# Patient Record
Sex: Female | Born: 1945 | Race: White | Hispanic: No | Marital: Married | Smoking: Never smoker
Health system: Southern US, Community
[De-identification: ages and names within clinical notes are randomized; demographics above are authoritative.]

## PROBLEM LIST (undated history)

## (undated) DIAGNOSIS — IMO0001 Reserved for inherently not codable concepts without codable children: Secondary | ICD-10-CM

## (undated) DIAGNOSIS — M797 Fibromyalgia: Secondary | ICD-10-CM

## (undated) DIAGNOSIS — R911 Solitary pulmonary nodule: Secondary | ICD-10-CM

## (undated) DIAGNOSIS — F5102 Adjustment insomnia: Secondary | ICD-10-CM

## (undated) DIAGNOSIS — K219 Gastro-esophageal reflux disease without esophagitis: Secondary | ICD-10-CM

## (undated) DIAGNOSIS — D369 Benign neoplasm, unspecified site: Secondary | ICD-10-CM

## (undated) DIAGNOSIS — G4733 Obstructive sleep apnea (adult) (pediatric): Secondary | ICD-10-CM

## (undated) DIAGNOSIS — E669 Obesity, unspecified: Secondary | ICD-10-CM

## (undated) DIAGNOSIS — I509 Heart failure, unspecified: Secondary | ICD-10-CM

## (undated) DIAGNOSIS — K2270 Barrett's esophagus without dysplasia: Secondary | ICD-10-CM

## (undated) DIAGNOSIS — C569 Malignant neoplasm of unspecified ovary: Secondary | ICD-10-CM

## (undated) DIAGNOSIS — K222 Esophageal obstruction: Secondary | ICD-10-CM

## (undated) DIAGNOSIS — I10 Essential (primary) hypertension: Secondary | ICD-10-CM

## (undated) DIAGNOSIS — E119 Type 2 diabetes mellitus without complications: Secondary | ICD-10-CM

## (undated) DIAGNOSIS — E785 Hyperlipidemia, unspecified: Secondary | ICD-10-CM

## (undated) DIAGNOSIS — K449 Diaphragmatic hernia without obstruction or gangrene: Secondary | ICD-10-CM

## (undated) HISTORY — DX: Type 2 diabetes mellitus without complications: E11.9

## (undated) HISTORY — DX: Gastro-esophageal reflux disease without esophagitis: K21.9

## (undated) HISTORY — DX: Diaphragmatic hernia without obstruction or gangrene: K44.9

## (undated) HISTORY — DX: Esophageal obstruction: K22.2

## (undated) HISTORY — PX: CHOLECYSTECTOMY: SHX55

## (undated) HISTORY — DX: Hyperlipidemia, unspecified: E78.5

## (undated) HISTORY — DX: Heart failure, unspecified: I50.9

## (undated) HISTORY — DX: Barrett's esophagus without dysplasia: K22.70

## (undated) HISTORY — DX: Malignant neoplasm of unspecified ovary: C56.9

## (undated) HISTORY — DX: Benign neoplasm, unspecified site: D36.9

## (undated) HISTORY — DX: Essential (primary) hypertension: I10

## (undated) HISTORY — PX: TONSILLECTOMY: SUR1361

## (undated) HISTORY — DX: Reserved for inherently not codable concepts without codable children: IMO0001

## (undated) HISTORY — DX: Fibromyalgia: M79.7

## (undated) HISTORY — DX: Adjustment insomnia: F51.02

## (undated) HISTORY — DX: Solitary pulmonary nodule: R91.1

## (undated) HISTORY — PX: REPLACEMENT TOTAL KNEE: SUR1224

## (undated) HISTORY — DX: Obesity, unspecified: E66.9

## (undated) HISTORY — DX: Obstructive sleep apnea (adult) (pediatric): G47.33

## (undated) HISTORY — PX: TRACHEOSTOMY: SUR1362

---

## 1993-03-14 ENCOUNTER — Encounter: Payer: Self-pay | Admitting: Gastroenterology

## 1993-03-15 ENCOUNTER — Encounter: Payer: Self-pay | Admitting: Gastroenterology

## 2001-03-26 ENCOUNTER — Other Ambulatory Visit: Admission: RE | Admit: 2001-03-26 | Discharge: 2001-03-26 | Payer: Self-pay | Admitting: Obstetrics and Gynecology

## 2001-04-24 ENCOUNTER — Emergency Department (HOSPITAL_COMMUNITY): Admission: EM | Admit: 2001-04-24 | Discharge: 2001-04-25 | Payer: Self-pay | Admitting: Emergency Medicine

## 2001-11-04 ENCOUNTER — Ambulatory Visit (HOSPITAL_COMMUNITY): Admission: RE | Admit: 2001-11-04 | Discharge: 2001-11-04 | Payer: Self-pay

## 2002-03-05 ENCOUNTER — Encounter: Payer: Self-pay | Admitting: Family Medicine

## 2002-03-05 ENCOUNTER — Ambulatory Visit (HOSPITAL_COMMUNITY): Admission: RE | Admit: 2002-03-05 | Discharge: 2002-03-05 | Payer: Self-pay | Admitting: Family Medicine

## 2002-09-09 ENCOUNTER — Inpatient Hospital Stay (HOSPITAL_COMMUNITY): Admission: EM | Admit: 2002-09-09 | Discharge: 2002-09-10 | Payer: Self-pay | Admitting: Emergency Medicine

## 2002-09-09 ENCOUNTER — Encounter: Payer: Self-pay | Admitting: Emergency Medicine

## 2002-09-22 ENCOUNTER — Encounter (INDEPENDENT_AMBULATORY_CARE_PROVIDER_SITE_OTHER): Payer: Self-pay | Admitting: *Deleted

## 2002-09-22 ENCOUNTER — Ambulatory Visit (HOSPITAL_COMMUNITY): Admission: RE | Admit: 2002-09-22 | Discharge: 2002-09-22 | Payer: Self-pay | Admitting: Cardiovascular Disease

## 2002-09-22 ENCOUNTER — Encounter: Payer: Self-pay | Admitting: Cardiovascular Disease

## 2002-09-22 ENCOUNTER — Ambulatory Visit: Admission: RE | Admit: 2002-09-22 | Discharge: 2002-09-22 | Payer: Self-pay | Admitting: Cardiovascular Disease

## 2003-02-18 ENCOUNTER — Other Ambulatory Visit: Admission: RE | Admit: 2003-02-18 | Discharge: 2003-02-18 | Payer: Self-pay | Admitting: Obstetrics and Gynecology

## 2004-06-27 ENCOUNTER — Ambulatory Visit: Payer: Self-pay | Admitting: Family Medicine

## 2004-08-31 ENCOUNTER — Ambulatory Visit: Payer: Self-pay | Admitting: Internal Medicine

## 2004-09-12 ENCOUNTER — Ambulatory Visit: Payer: Self-pay | Admitting: Internal Medicine

## 2004-09-12 ENCOUNTER — Ambulatory Visit (HOSPITAL_BASED_OUTPATIENT_CLINIC_OR_DEPARTMENT_OTHER): Admission: RE | Admit: 2004-09-12 | Discharge: 2004-09-12 | Payer: Self-pay | Admitting: Internal Medicine

## 2004-09-27 ENCOUNTER — Ambulatory Visit: Payer: Self-pay | Admitting: Internal Medicine

## 2005-11-13 ENCOUNTER — Ambulatory Visit: Payer: Self-pay | Admitting: Family Medicine

## 2005-11-30 ENCOUNTER — Ambulatory Visit: Payer: Self-pay | Admitting: Gastroenterology

## 2005-12-03 ENCOUNTER — Ambulatory Visit: Payer: Self-pay | Admitting: Family Medicine

## 2005-12-18 ENCOUNTER — Encounter (INDEPENDENT_AMBULATORY_CARE_PROVIDER_SITE_OTHER): Payer: Self-pay | Admitting: Specialist

## 2005-12-18 ENCOUNTER — Ambulatory Visit: Payer: Self-pay | Admitting: Gastroenterology

## 2006-07-16 ENCOUNTER — Ambulatory Visit: Payer: Self-pay | Admitting: Family Medicine

## 2006-08-21 ENCOUNTER — Ambulatory Visit: Payer: Self-pay | Admitting: Family Medicine

## 2006-12-05 ENCOUNTER — Ambulatory Visit: Payer: Self-pay | Admitting: Family Medicine

## 2006-12-05 LAB — CONVERTED CEMR LAB: Anti Nuclear Antibody(ANA): NEGATIVE

## 2006-12-06 LAB — CONVERTED CEMR LAB
ALT: 19 units/L (ref 0–40)
Albumin: 3.8 g/dL (ref 3.5–5.2)
Basophils Absolute: 0 10*3/uL (ref 0.0–0.1)
Bilirubin, Direct: 0.1 mg/dL (ref 0.0–0.3)
Calcium: 8.9 mg/dL (ref 8.4–10.5)
Cholesterol: 213 mg/dL (ref 0–200)
Direct LDL: 143.8 mg/dL
Eosinophils Absolute: 0.1 10*3/uL (ref 0.0–0.6)
Eosinophils Relative: 1.8 % (ref 0.0–5.0)
GFR calc Af Amer: 59 mL/min
GFR calc non Af Amer: 49 mL/min
Glucose, Bld: 86 mg/dL (ref 70–99)
HDL: 33.1 mg/dL — ABNORMAL LOW (ref >39.0)
Lymphocytes Relative: 25.6 % (ref 12.0–46.0)
MCHC: 33.9 g/dL (ref 30.0–36.0)
MCV: 81.9 fL (ref 78.0–100.0)
Neutro Abs: 5 10*3/uL (ref 1.4–7.7)
Platelets: 249 10*3/uL (ref 150–400)
RBC: 4.65 M/uL (ref 3.87–5.11)
Sodium: 146 meq/L — ABNORMAL HIGH (ref 135–145)
TSH: 1.22 microintl units/mL (ref 0.35–5.50)
Total CK: 61 units/L (ref 7–177)
Triglycerides: 205 mg/dL (ref 0–149)
WBC: 7.6 10*3/uL (ref 4.5–10.5)

## 2006-12-20 ENCOUNTER — Encounter: Admission: RE | Admit: 2006-12-20 | Discharge: 2006-12-20 | Payer: Self-pay | Admitting: Neurosurgery

## 2007-01-30 ENCOUNTER — Ambulatory Visit: Payer: Self-pay | Admitting: Family Medicine

## 2007-03-21 DIAGNOSIS — F329 Major depressive disorder, single episode, unspecified: Secondary | ICD-10-CM

## 2007-03-21 DIAGNOSIS — E669 Obesity, unspecified: Secondary | ICD-10-CM

## 2007-03-21 DIAGNOSIS — E785 Hyperlipidemia, unspecified: Secondary | ICD-10-CM

## 2007-03-21 DIAGNOSIS — F3289 Other specified depressive episodes: Secondary | ICD-10-CM

## 2007-03-21 DIAGNOSIS — M797 Fibromyalgia: Secondary | ICD-10-CM | POA: Insufficient documentation

## 2007-03-21 DIAGNOSIS — I10 Essential (primary) hypertension: Secondary | ICD-10-CM

## 2007-03-21 HISTORY — DX: Obesity, unspecified: E66.9

## 2007-03-21 HISTORY — DX: Other specified depressive episodes: F32.89

## 2007-03-21 HISTORY — DX: Hyperlipidemia, unspecified: E78.5

## 2007-03-21 HISTORY — DX: Essential (primary) hypertension: I10

## 2007-04-08 ENCOUNTER — Ambulatory Visit: Payer: Self-pay | Admitting: Gastroenterology

## 2007-04-15 ENCOUNTER — Ambulatory Visit: Payer: Self-pay | Admitting: Internal Medicine

## 2007-04-15 ENCOUNTER — Ambulatory Visit (HOSPITAL_COMMUNITY): Admission: RE | Admit: 2007-04-15 | Discharge: 2007-04-15 | Payer: Self-pay | Admitting: Internal Medicine

## 2007-04-15 LAB — CONVERTED CEMR LAB
BUN: 20 mg/dL (ref 6–23)
GFR calc non Af Amer: 54 mL/min
Potassium: 3.8 meq/L (ref 3.5–5.1)
Pro B Natriuretic peptide (BNP): 46 pg/mL (ref 0.0–100.0)
Sodium: 143 meq/L (ref 135–145)

## 2007-05-12 ENCOUNTER — Ambulatory Visit: Payer: Self-pay | Admitting: Internal Medicine

## 2007-05-21 DIAGNOSIS — D369 Benign neoplasm, unspecified site: Secondary | ICD-10-CM

## 2007-05-21 HISTORY — DX: Benign neoplasm, unspecified site: D36.9

## 2007-05-22 ENCOUNTER — Encounter: Payer: Self-pay | Admitting: Gastroenterology

## 2007-05-22 ENCOUNTER — Encounter: Payer: Self-pay | Admitting: Family Medicine

## 2007-05-22 ENCOUNTER — Ambulatory Visit: Payer: Self-pay | Admitting: Gastroenterology

## 2007-06-24 ENCOUNTER — Ambulatory Visit: Payer: Self-pay | Admitting: Family Medicine

## 2007-06-24 LAB — CONVERTED CEMR LAB
Albumin: 3.8 g/dL (ref 3.5–5.2)
Basophils Absolute: 0.1 10*3/uL (ref 0.0–0.1)
Creatinine, Ser: 1.3 mg/dL — ABNORMAL HIGH (ref 0.4–1.2)
Direct LDL: 165.5 mg/dL
Eosinophils Absolute: 0.2 10*3/uL (ref 0.0–0.6)
HCT: 36.7 % (ref 36.0–46.0)
Hemoglobin: 12.6 g/dL (ref 12.0–15.0)
Lymphocytes Relative: 25.2 % (ref 12.0–46.0)
MCHC: 34.5 g/dL (ref 30.0–36.0)
MCV: 83 fL (ref 78.0–100.0)
Monocytes Absolute: 0.8 10*3/uL — ABNORMAL HIGH (ref 0.2–0.7)
Neutro Abs: 5.4 10*3/uL (ref 1.4–7.7)
Neutrophils Relative %: 62.9 % (ref 43.0–77.0)
Potassium: 4.3 meq/L (ref 3.5–5.1)
RDW: 13.6 % (ref 11.5–14.6)
Sodium: 143 meq/L (ref 135–145)
TSH: 1.56 microintl units/mL (ref 0.35–5.50)
Total Bilirubin: 0.7 mg/dL (ref 0.3–1.2)

## 2007-07-24 DIAGNOSIS — M25579 Pain in unspecified ankle and joints of unspecified foot: Secondary | ICD-10-CM

## 2007-07-24 HISTORY — DX: Pain in unspecified ankle and joints of unspecified foot: M25.579

## 2007-08-19 ENCOUNTER — Ambulatory Visit: Payer: Self-pay | Admitting: Family Medicine

## 2007-08-20 ENCOUNTER — Telehealth: Payer: Self-pay | Admitting: Family Medicine

## 2007-08-21 DIAGNOSIS — C569 Malignant neoplasm of unspecified ovary: Secondary | ICD-10-CM

## 2007-08-21 HISTORY — PX: VAGINAL HYSTERECTOMY: SUR661

## 2007-08-21 HISTORY — DX: Malignant neoplasm of unspecified ovary: C56.9

## 2007-09-12 ENCOUNTER — Encounter: Payer: Self-pay | Admitting: Family Medicine

## 2007-09-24 ENCOUNTER — Telehealth: Payer: Self-pay | Admitting: Family Medicine

## 2007-10-10 ENCOUNTER — Ambulatory Visit: Payer: Self-pay | Admitting: Family Medicine

## 2007-10-10 DIAGNOSIS — R209 Unspecified disturbances of skin sensation: Secondary | ICD-10-CM

## 2007-10-10 HISTORY — DX: Unspecified disturbances of skin sensation: R20.9

## 2007-11-07 DIAGNOSIS — J45909 Unspecified asthma, uncomplicated: Secondary | ICD-10-CM

## 2007-11-07 DIAGNOSIS — G4733 Obstructive sleep apnea (adult) (pediatric): Secondary | ICD-10-CM | POA: Insufficient documentation

## 2007-11-07 DIAGNOSIS — K449 Diaphragmatic hernia without obstruction or gangrene: Secondary | ICD-10-CM | POA: Insufficient documentation

## 2007-11-07 HISTORY — DX: Obstructive sleep apnea (adult) (pediatric): G47.33

## 2007-11-07 HISTORY — DX: Unspecified asthma, uncomplicated: J45.909

## 2007-11-10 ENCOUNTER — Telehealth: Payer: Self-pay | Admitting: Internal Medicine

## 2007-11-10 DIAGNOSIS — J984 Other disorders of lung: Secondary | ICD-10-CM

## 2007-11-10 HISTORY — DX: Other disorders of lung: J98.4

## 2007-11-21 ENCOUNTER — Ambulatory Visit (HOSPITAL_COMMUNITY): Admission: RE | Admit: 2007-11-21 | Discharge: 2007-11-21 | Payer: Self-pay | Admitting: Internal Medicine

## 2007-11-21 ENCOUNTER — Encounter: Payer: Self-pay | Admitting: Internal Medicine

## 2007-11-25 ENCOUNTER — Ambulatory Visit: Payer: Self-pay | Admitting: Family Medicine

## 2007-11-25 DIAGNOSIS — IMO0001 Reserved for inherently not codable concepts without codable children: Secondary | ICD-10-CM

## 2007-11-25 DIAGNOSIS — K2990 Gastroduodenitis, unspecified, without bleeding: Secondary | ICD-10-CM

## 2007-11-25 DIAGNOSIS — K219 Gastro-esophageal reflux disease without esophagitis: Secondary | ICD-10-CM | POA: Insufficient documentation

## 2007-11-25 DIAGNOSIS — K2970 Gastritis, unspecified, without bleeding: Secondary | ICD-10-CM

## 2007-11-25 DIAGNOSIS — K2980 Duodenitis without bleeding: Secondary | ICD-10-CM | POA: Insufficient documentation

## 2007-11-25 HISTORY — DX: Gastro-esophageal reflux disease without esophagitis: K21.9

## 2007-11-25 HISTORY — DX: Gastritis, unspecified, without bleeding: K29.70

## 2007-11-25 HISTORY — DX: Reserved for inherently not codable concepts without codable children: IMO0001

## 2007-11-25 LAB — CONVERTED CEMR LAB
Alkaline Phosphatase: 72 units/L (ref 39–117)
Anti Nuclear Antibody(ANA): NEGATIVE
Basophils Absolute: 0 10*3/uL (ref 0.0–0.1)
Basophils Relative: 0 % (ref 0.0–1.0)
Bilirubin, Direct: 0.2 mg/dL (ref 0.0–0.3)
Folate: >20 ng/mL
GFR calc Af Amer: 59 mL/min
GFR calc non Af Amer: 49 mL/min
Lymphocytes Relative: 13.9 % (ref 12.0–46.0)
MCHC: 33.3 g/dL (ref 30.0–36.0)
Neutrophils Relative %: 80 % — ABNORMAL HIGH (ref 43.0–77.0)
Potassium: 3.5 meq/L (ref 3.5–5.1)
RBC: 4.54 M/uL (ref 3.87–5.11)
RDW: 15.1 % — ABNORMAL HIGH (ref 11.5–14.6)
Saturation Ratios: 9.5 % — ABNORMAL LOW (ref 20.0–50.0)
Sodium: 142 meq/L (ref 135–145)
TSH: 0.95 microintl units/mL (ref 0.35–5.50)
Total Bilirubin: 0.7 mg/dL (ref 0.3–1.2)
Total CK: 35 units/L (ref 7–177)
Vitamin B-12: 462 pg/mL (ref 211–911)

## 2007-12-02 ENCOUNTER — Ambulatory Visit: Payer: Self-pay | Admitting: Family Medicine

## 2007-12-02 ENCOUNTER — Telehealth: Payer: Self-pay | Admitting: Family Medicine

## 2007-12-02 DIAGNOSIS — E119 Type 2 diabetes mellitus without complications: Secondary | ICD-10-CM

## 2007-12-02 DIAGNOSIS — M109 Gout, unspecified: Secondary | ICD-10-CM

## 2007-12-02 HISTORY — DX: Type 2 diabetes mellitus without complications: E11.9

## 2007-12-02 HISTORY — DX: Gout, unspecified: M10.9

## 2007-12-02 LAB — CONVERTED CEMR LAB
Basophils Absolute: 0 10*3/uL (ref 0.0–0.1)
GFR calc Af Amer: 65 mL/min
Glucose, Bld: 67 mg/dL — ABNORMAL LOW (ref 70–99)
Hgb A1c MFr Bld: 5.5 % (ref 4.6–6.0)
Lymphocytes Relative: 16.3 % (ref 12.0–46.0)
MCHC: 32.7 g/dL (ref 30.0–36.0)
Monocytes Absolute: 1 10*3/uL (ref 0.1–1.0)
Monocytes Relative: 10.8 % (ref 3.0–12.0)
Platelets: 230 10*3/uL (ref 150–400)
Potassium: 3.7 meq/L (ref 3.5–5.1)
RDW: 14.8 % — ABNORMAL HIGH (ref 11.5–14.6)
Sodium: 141 meq/L (ref 135–145)
Uric Acid, Serum: 6.9 mg/dL (ref 2.4–7.0)

## 2007-12-04 ENCOUNTER — Telehealth: Payer: Self-pay | Admitting: Family Medicine

## 2007-12-05 ENCOUNTER — Encounter: Payer: Self-pay | Admitting: Internal Medicine

## 2007-12-16 ENCOUNTER — Encounter: Payer: Self-pay | Admitting: Internal Medicine

## 2007-12-23 ENCOUNTER — Inpatient Hospital Stay (HOSPITAL_COMMUNITY): Admission: EM | Admit: 2007-12-23 | Discharge: 2007-12-27 | Payer: Self-pay | Admitting: Emergency Medicine

## 2007-12-23 ENCOUNTER — Ambulatory Visit: Payer: Self-pay | Admitting: Internal Medicine

## 2007-12-24 ENCOUNTER — Encounter: Payer: Self-pay | Admitting: Cardiology

## 2007-12-26 ENCOUNTER — Encounter (INDEPENDENT_AMBULATORY_CARE_PROVIDER_SITE_OTHER): Payer: Self-pay | Admitting: General Surgery

## 2008-01-06 ENCOUNTER — Ambulatory Visit: Payer: Self-pay | Admitting: Family Medicine

## 2008-03-04 ENCOUNTER — Encounter: Payer: Self-pay | Admitting: Family Medicine

## 2008-04-05 ENCOUNTER — Ambulatory Visit: Payer: Self-pay | Admitting: Internal Medicine

## 2008-04-12 ENCOUNTER — Telehealth: Payer: Self-pay | Admitting: Internal Medicine

## 2008-04-12 LAB — CONVERTED CEMR LAB
BUN: 35 mg/dL — ABNORMAL HIGH (ref 6–23)
CO2: 31 meq/L (ref 19–32)
Chloride: 105 meq/L (ref 96–112)
Eosinophils Relative: 1.7 % (ref 0.0–5.0)
Glucose, Bld: 113 mg/dL — ABNORMAL HIGH (ref 70–99)
HCT: 35.3 % — ABNORMAL LOW (ref 36.0–46.0)
Lymphocytes Relative: 21.5 % (ref 12.0–46.0)
Monocytes Absolute: 0.9 10*3/uL (ref 0.1–1.0)
Monocytes Relative: 10.3 % (ref 3.0–12.0)
Neutrophils Relative %: 65.5 % (ref 43.0–77.0)
Platelets: 469 10*3/uL — ABNORMAL HIGH (ref 150–400)
Potassium: 3.8 meq/L (ref 3.5–5.1)
RDW: 14.5 % (ref 11.5–14.6)
Sodium: 141 meq/L (ref 135–145)
WBC: 8.9 10*3/uL (ref 4.5–10.5)

## 2008-04-19 ENCOUNTER — Ambulatory Visit: Payer: Self-pay | Admitting: Family Medicine

## 2008-04-19 DIAGNOSIS — K8020 Calculus of gallbladder without cholecystitis without obstruction: Secondary | ICD-10-CM

## 2008-04-19 HISTORY — DX: Calculus of gallbladder without cholecystitis without obstruction: K80.20

## 2008-04-27 LAB — CONVERTED CEMR LAB
BUN: 19 mg/dL (ref 6–23)
CO2: 32 meq/L (ref 19–32)
Chloride: 109 meq/L (ref 96–112)
Creatinine, Ser: 1.1 mg/dL (ref 0.4–1.2)
Glucose, Bld: 95 mg/dL (ref 70–99)
Hgb A1c MFr Bld: 5.2 % (ref 4.6–6.0)
Potassium: 3.9 meq/L (ref 3.5–5.1)

## 2008-05-06 ENCOUNTER — Ambulatory Visit: Payer: Self-pay | Admitting: Internal Medicine

## 2008-07-16 ENCOUNTER — Telehealth: Payer: Self-pay | Admitting: Family Medicine

## 2008-12-09 DIAGNOSIS — Z860100 Personal history of colon polyps, unspecified: Secondary | ICD-10-CM | POA: Insufficient documentation

## 2008-12-09 DIAGNOSIS — K649 Unspecified hemorrhoids: Secondary | ICD-10-CM

## 2008-12-09 DIAGNOSIS — K222 Esophageal obstruction: Secondary | ICD-10-CM

## 2008-12-09 HISTORY — DX: Personal history of colon polyps, unspecified: Z86.0100

## 2008-12-09 HISTORY — DX: Esophageal obstruction: K22.2

## 2008-12-09 HISTORY — DX: Unspecified hemorrhoids: K64.9

## 2008-12-09 HISTORY — DX: Personal history of colonic polyps: Z86.010

## 2008-12-10 ENCOUNTER — Ambulatory Visit: Payer: Self-pay | Admitting: Gastroenterology

## 2008-12-10 DIAGNOSIS — K6289 Other specified diseases of anus and rectum: Secondary | ICD-10-CM

## 2008-12-10 DIAGNOSIS — R1319 Other dysphagia: Secondary | ICD-10-CM

## 2008-12-10 HISTORY — DX: Other dysphagia: R13.19

## 2008-12-16 ENCOUNTER — Ambulatory Visit (HOSPITAL_COMMUNITY): Admission: RE | Admit: 2008-12-16 | Discharge: 2008-12-16 | Payer: Self-pay | Admitting: Gastroenterology

## 2008-12-28 ENCOUNTER — Ambulatory Visit: Payer: Self-pay | Admitting: Gastroenterology

## 2009-04-12 IMAGING — CT CT ANGIO CHEST
2 of 6 series · 19 of 36 positions shown · IV contrast (APPLIED)
Comparison: Chest CT 11/21/2007.

CLINICAL DATA: Chest pain and dyspnea.  Question acute pulmonary
embolism.

CT ANGIOGRAPHY CHEST
TECHNIQUE: Multidetector CT imaging of the chest using the
standard protocol during bolus administration of intravenous
contrast. Multiplanar reconstructed images obtained and reviewed to
evaluate the vascular anatomy.
Contrast: 100 ml 3mnipaque-BTT intravenously.

[Series 6: pulm embolism 0.75 thins · axial · 0.60mm/px · z∈[-283,-64]mm · 16 of 822 slices shown]
[im 46/822  lung]
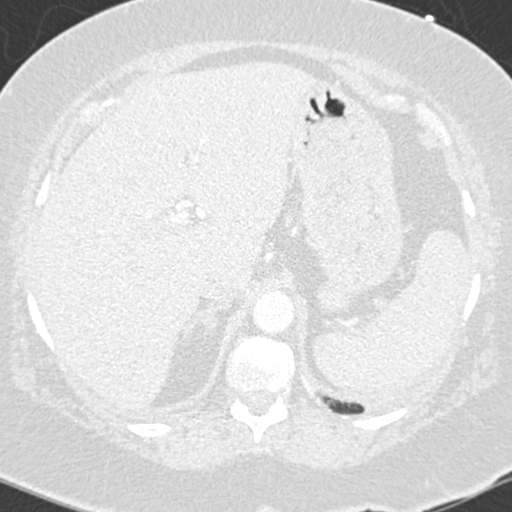
[im 92/822  mediastinal]
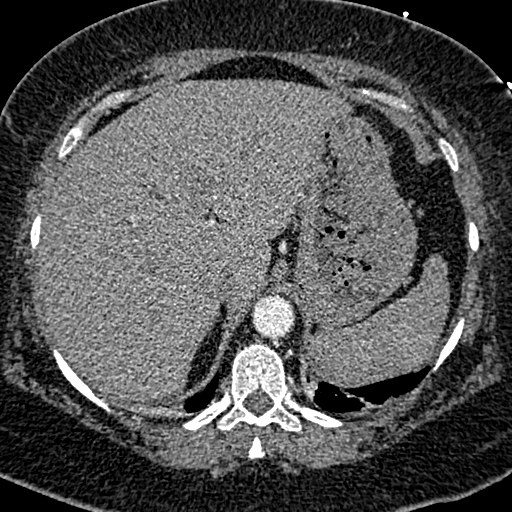
[im 137/822  lung]
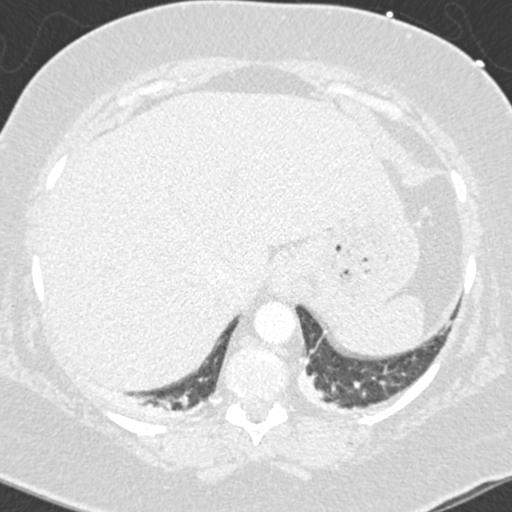
[im 183/822  mediastinal]
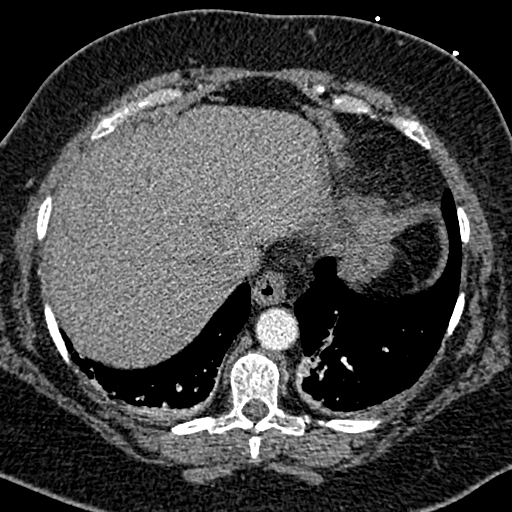
[im 229/822  lung]
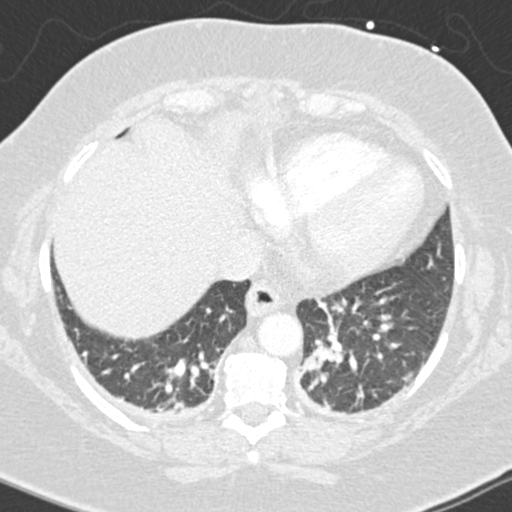
[im 274/822  mediastinal]
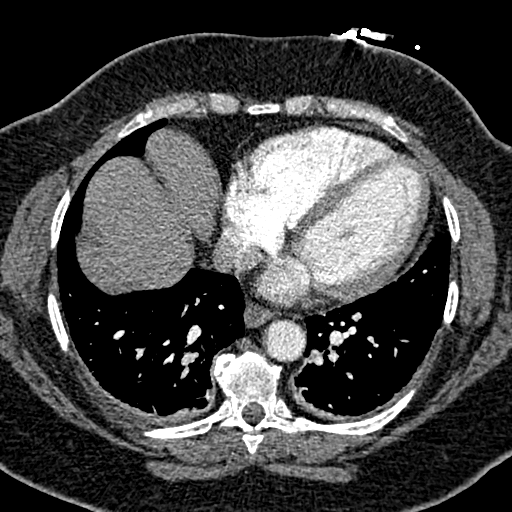
[im 320/822  lung]
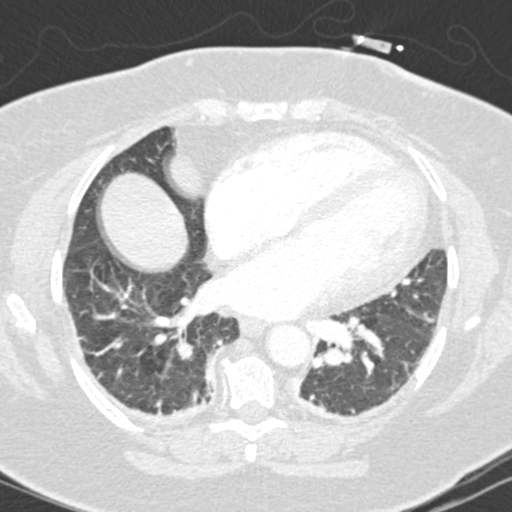
[im 365/822  mediastinal]
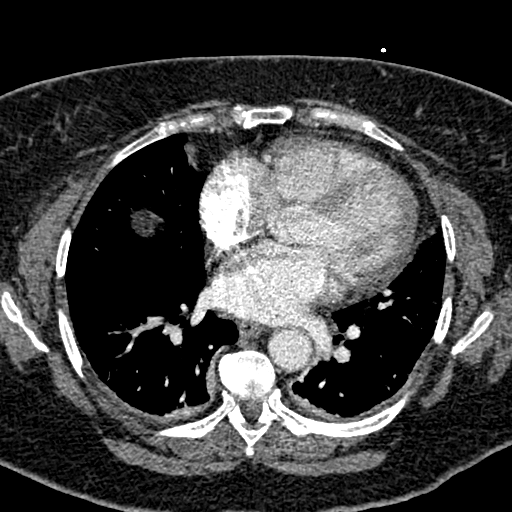
[im 457/822  lung]
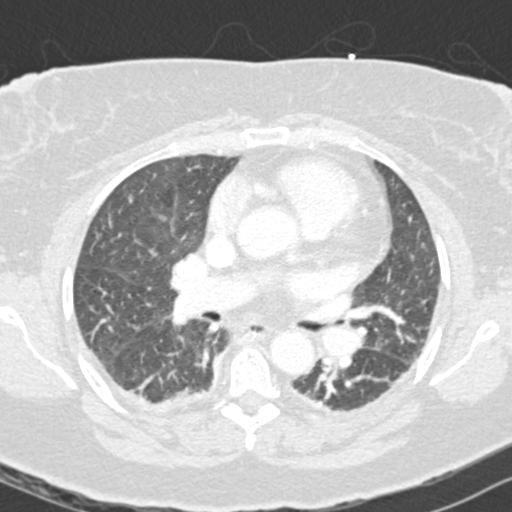
[im 502/822  mediastinal]
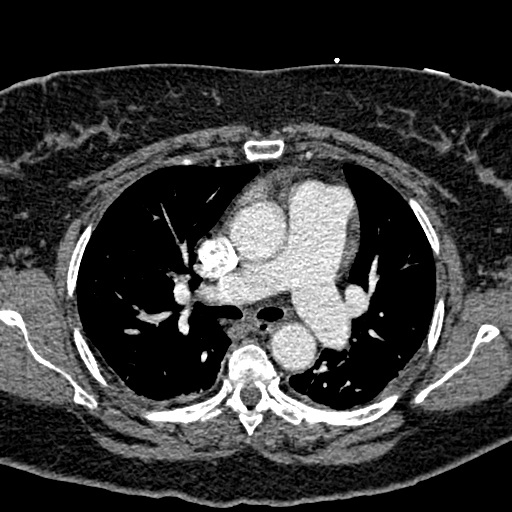
[im 548/822  lung]
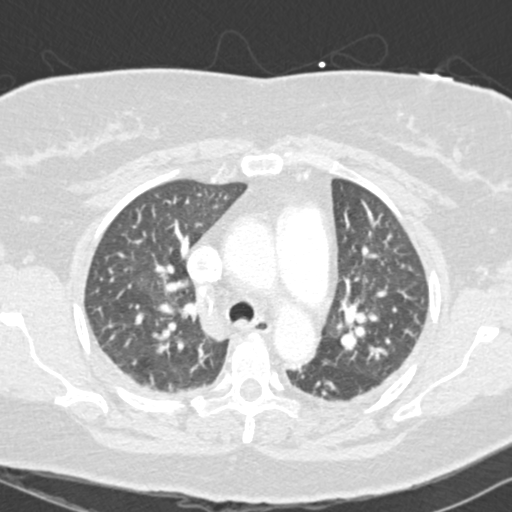
[im 593/822  mediastinal]
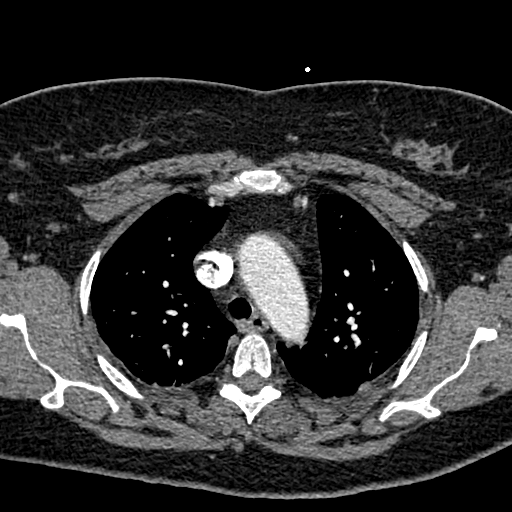
[im 639/822  lung]
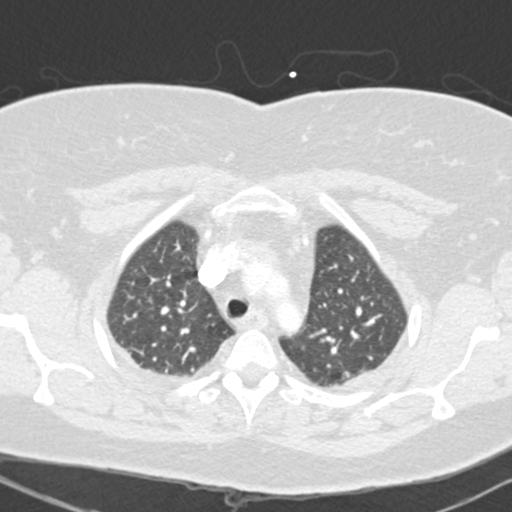
[im 685/822  mediastinal]
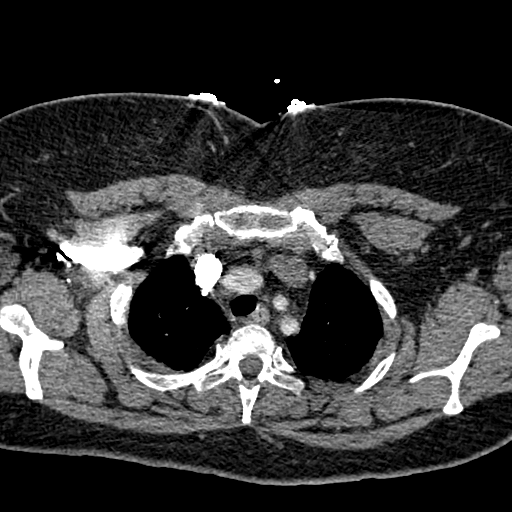
[im 730/822  lung]
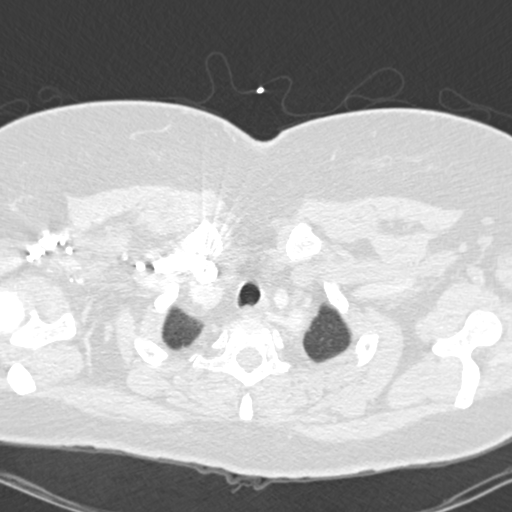
[im 776/822  mediastinal]
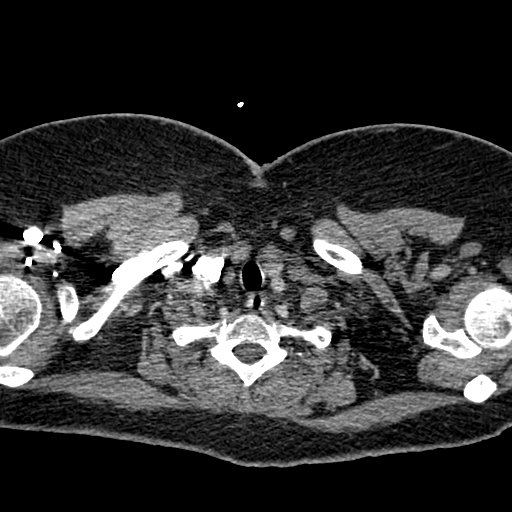

[Series 7: pulm embolism 2.0 cor · coronal · 0.63mm/px · 3 of 122 slices shown]
[im 25/122  mediastinal]
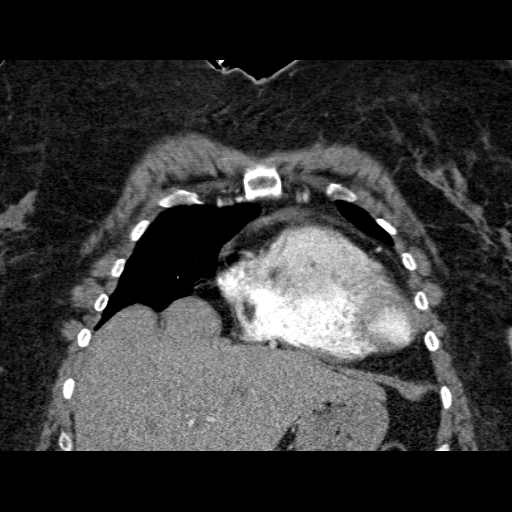
[im 49/122  mediastinal]
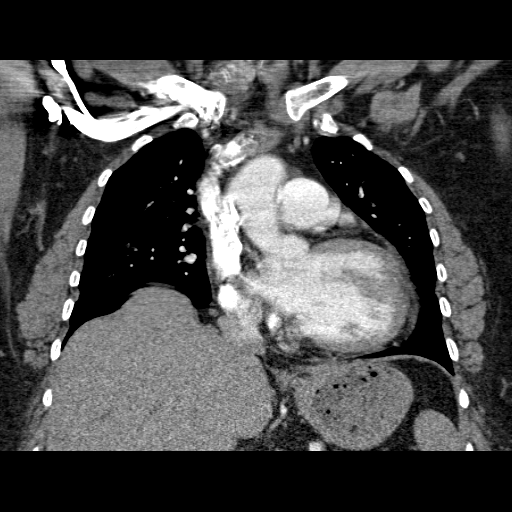
[im 73/122  mediastinal]
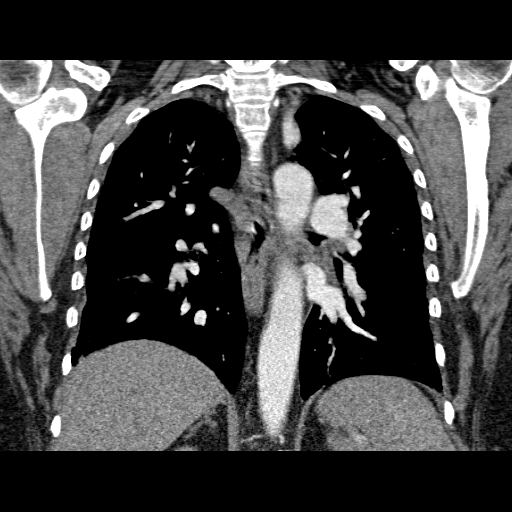

[19 of 36 positions shown; findings below may reference images not displayed]

FINDINGS: Contrast opacification of the pulmonary arteries is
suboptimal.  No central pulmonary emboli are demonstrated.  Mild
central enlargement of the pulmonary arteries appears stable.  The
thoracic aorta appears unremarkable.

There are no enlarged mediastinal or hilar lymph nodes.  There is
no pleural or pericardial effusion.

Lung windows demonstrate no change in the subpleural left lower
lobe nodule.  This measures 8 x 6 mm on image 61.  The patient has
developed dependent atelectasis in both lower lobes.  There is no
confluent airspace opacity or endobronchial lesion.

Images through the upper abdomen demonstrate no abnormality.
IMPRESSION: 1.  No evidence of acute pulmonary embolism. Peripheral vascular
assessment is limited.
2.  New bibasilar atelectasis.
3.  Stable left lower lobe nodule from recent CT of 11/21/2007.

Preliminary  interpretation for this examination was rendered by
Dr. Menard.  Final dictation was delayed by PACS upgrade.

## 2010-09-10 ENCOUNTER — Encounter: Payer: Self-pay | Admitting: Internal Medicine

## 2010-11-28 LAB — GLUCOSE, CAPILLARY: Glucose-Capillary: 84 mg/dL (ref 70–99)

## 2010-12-04 ENCOUNTER — Ambulatory Visit: Payer: Self-pay | Admitting: Gastroenterology

## 2011-01-02 NOTE — Assessment & Plan Note (Signed)
[facility]                             PULMONARY OFFICE NOTE   NAME:[name], Malecki                       MRN:          [id number]  DATE:04/15/2007                            DOB:          04-Dec-1945    PROBLEM:  1. Obstructive sleep apnea.  2. Asthma.  3. 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  4. Hiatal hernia.   HISTORY:  I had last seen her in 2006 after sleep study. She has been  using CPAP for two years and we are checking records, but I think she  still might be using auto-titration. She comes now because of three  months of increased shortness of breath with exertion. This is a  constant discomfort with no chest pain or palpitations. She is  comfortable at rest in supine. She had a heart cath two years ago by Dr.  Bobette Mo and understood that she was clear at that time. Weight has  been stable. She has not had active asthma symptoms in years. She had a  total knee replacement last December with no problems. She does have a  remote history of DVT of the calf after that surgery and took Coumadin  for three months. She says now her dyspnea prevents her from cleaning  house or shopping. 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   MEDICATIONS:  1. Cartia XT 120 mg.  2. Fluoxetine 20 mg.  3. Furosemide 40 mg b.i.d.  4. Lisinopril 20 mg b.i.d.  5. Celebrex 200 mg.  6. Detrol LA 4 mg b.i.d.  7. Ranitidine 150 mg b.i.d.   DRUG INTOLERANCES:  PENICILLIN, ERYTHROMYCIN, CODEINE WITH UPSET  STOMACH.   OBJECTIVE:  Weight 245 pounds, blood pressure 108/68, pulse 52.  Room  air saturation 98%. She is obese, alert and in no evident distress.  Status post palatoplasty and she has a tracheostomy scar both related to  the complicated tonsillectomy. Heavy legs with negative Homan's. No  obvious cords. She has no stridor. No post-nasal drainage. Lung fields  sound clear without rales,  dullness or cough.   IMPRESSION:  1. Dyspnea. Concerning history of deep venous thrombosis after total      knee replacement in December treated for three months with Coumadin      and no evidence then of pulmonary embolism.  2. Obstructive sleep apnea, comfortable on continuous positive airway      pressure provided by [facility] and I believe this is still auto-      titration rather than a fixed pressure.   PLAN:  1. CBC with differential to exclude anemia.  2. D-dimer.  3. B-natruretic peptide.  4. Chest CT to rule out pulmonary embolism.  5. Schedule return in two weeks.   ADDENDUM:  1. Chest CT showed a 7-mm left lower lobe nodule with low concern in      this never smoker with two other tiny nodules. No evidence of      pulmonary embolism.  2. D-dimer was elevated at 0.99.  3. Chemistry panel was  normal with B-natruretic peptide at 46.   IMPRESSION:  These labs do not explain her dyspnea complaint. On return,  I will try to arrange a 6 minute walk test with oximetry.     Clinton D. Maple Hudson, MD, Tonny Bollman, FACP  Electronically Signed    CDY/MedQ  DD: 04/21/2007  DT: 04/21/2007  Job #: [id number]   cc:   Tinnie Gens A. Tawanna Cooler, MD

## 2011-01-02 NOTE — Assessment & Plan Note (Signed)
[facility]                             PULMONARY OFFICE NOTE   NAME:[name], [name]                       MRN:          [id number]  DATE:05/12/2007                            DOB:          02-05-1946    PROBLEM LIST:  1. Obstructive sleep apnea.  2. Asthma.  3. Old tracheostomy after a traumatic tonsillectomy in 1997.  4. Hiatal hernia.   HISTORY:  Breathing has been stable.  No cough despite being on  Lisinopril, we discussed this.  Her head and face occasionally sweat  with exertion.  She is comfortable using CPAP.  There have been no  sudden events or significant changes.   MEDICATIONS:  1. Celebrex 200 mg.  2. Lisinopril 20 mg b.i.d.  3. Detrol LA 4 mg b.i.d.  4. Furosemide 40 mg b.i.d.  5. Fluoxetine 20 mg.  6. Cartia XT 120 mg.   DRUG INTOLERANT:  1. PENICILLIN.  2. ERYTHROMYCIN.  3. ASPIRIN.  4. CODEINE.   CPAP continues by auto-titration on a chronic basis through [facility].   OBJECTIVE:  VITAL SIGNS:  BP 120/70, pulse regular 56, room air  saturation 98%.  CHEST:  Clear.  Breathing is unlabored.  HEART:  Heart sounds are regular without murmur or gallop.  NECK:  I do not find neck vein distention, stridor, adenopathy, or  edema.  GENERAL:  She is overweight.   Chest CT on April 15, 2007, at [facility], showed a 7-mm left  lower lobe pulmonary nodule with radiology suggesting a 31-month followup  and noting 2 other tiny pulmonary nodules with no evidence of pulmonary  embolism.  Blood work on August 26th, included a B natriuretic peptide  normal at 46 and a normal chemistry profile including BUN of 20,  creatinine of 1.1, potassium of 3.8.  D-dimer was 0.99, which was  considered in ordering the CT scan.  Her sleep study on September 12, 2004, had given an index of 16.6 per hour with desaturation to 84% at  that time.  Pulmonary function tests on September 22nd, showed mild  restriction of total lung capacity at 76%  of predicted, mild obstructive  airways disease mainly in small airways where there was a significant  response to bronchodilator.  Diffusion was mildly reduced at 72%.  On a  6-minute walk test, she went 428 meters in 6 minutes with normal  cardiopulmonary response.  Oxygen saturation held steady at 99-98% on  room air, not showing evidence of a pulmonary limitation to exercise.   IMPRESSION:  1. Small lung nodules, nonspecific.  2. Obstructive sleep apnea comfortable on CPAP with auto-titration.  3. Mild dyspnea with pulmonary function test most consistent with an      obesity hypoventilation pattern.   PLAN:  1. Encourage walking and weight loss.  2. Schedule return in 6 months, earlier p.r.n.  3. She is given sample albuterol inhaler for p.r.n. use especially      before exercise.     Clinton D. Maple Hudson, MD, Tonny Bollman, FACP  Electronically Signed    CDY/MedQ  DD: 05/14/2007  DT:  05/15/2007  Job #: [id number]   cc:   Tinnie Gens A. Tawanna Cooler, MD  Nanetta Batty, M.D.

## 2011-01-02 NOTE — Op Note (Signed)
[name], [name]              ACCOUNT NO.:  [id number]   MEDICAL RECORD NO.:  [id number]           PATIENT TYPE:   LOCATION:                                 FACILITY:   PHYSICIAN:  Adolph Pollack, M.D.DATE OF BIRTH:  03-16-1946   DATE OF PROCEDURE:  DATE OF DISCHARGE:                               OPERATIVE REPORT   PREOPERATIVE DIAGNOSIS:  Acute cholecystitis.   POSTOPERATIVE DIAGNOSIS:  Early acute cholecystitis.   PROCEDURE:  Laparoscopic cholecystectomy with intraoperative  cholangiogram.   SURGEON:  Adolph Pollack, MD.   ASSISTANT:  Wilmon Arms. Corliss Skains, MD.   ANESTHESIA:  General.   INDICATIONS:  This is a 65 year old female who presented with severe  substernal pressure-type pain radiating through to her back.  She also  had an elevation of white blood cell count.  It was felt that she may be  having a cardiac event, but this was ruled out.  She subsequently  underwent an ultrasound, demonstrated a large gallstone to be in the  neck of the gallbladder.  She was immediately placed on IV antibiotics.  She is now brought to the operating room for urgent cholecystectomy.  Procedure and the risks were explained to her preop.   TECHNIQUE:  She was brought to the operating room, placed supine on the  operating table and general anesthetic was administered.  The Foley  catheter was inserted and the abdominal wall sterilely prepped and  draped.  She had a previous supraumbilical hernia repair.  Local  anesthetic was infiltrated inferior to the umbilicus, and a subumbilical  incision was made through the skin, subcutaneous tissue, fascia, and  peritoneum under direct vision.  A pursestring suture of 0 Vicryl was  placed around the fascial edges.  A Hasson trocar was introduced into  the peritoneal cavity and pneumoperitoneum was created by insufflation  of CO2 gas.   Next, a laparoscope was introduced.  She was placed in a reverse  Trendelenburg position and the  right side tilted slightly up.  An 11-mm  trocar was placed through an epigastric incision and two 5-mm trocar was  placed in the right mid lateral abdomen.  The gallbladder was  visualized, and there was no significant inflammatory change, but it was  edematous.  The fundus was grasped and retracted toward the right  shoulder.  There was some bile leakage from where the fundus was  grasped, but no stone leakage.  The infundibulum was then grasped and  retracted laterally.  Using careful blunt dissection, the infundibulum  was then mobilized.  There was an anterior branch of the cystic artery  lying directly anterior to the cystic duct.  I created a window around  the anterior branch of the cystic artery and then it was clipped and  divided.  I then was able to use blunt dissection to create a window  around the cystic duct.  A clip was placed at the cystic duct-  gallbladder junction.  A small incision was made in the cystic duct and  bile milked back.  A cholangiocatheter was then passed through the  abdominal  wall, placed into the cystic duct, and the cholangiogram was  performed.   Under real-time fluoroscopy, dilute contrast was injected into the  cystic duct which was of moderate to long length.  The common hepatic,  right and left hepatic, and common bile ducts were all opacified  promptly and contrast drained promptly to the duodenum without obvious  evidence of obstruction.  It also appeared that the cystic duct may  actually come off the right hepatic duct.  Final report is pending the  radiologist interpretation.   Following this, the cholangiocatheter was removed, the cystic duct was  closed 3 times on the biliary side and then divided.  The posterior  branch of the cystic artery was clipped and divided.  The gallbladder  was then dissected free from liver using electrocautery and placed in  Endopouch bag.  The gallbladder fossa was copiously irrigated and  bleeding points  controlled with a cautery.  Reinspection demonstrated no  bleeding and no bile leak.   The gallbladder was then removed through the subumbilical incision.  The  subumbilical fascial defect was closed under laparoscopic vision by  tightening up and tying down the pursestring suture.  The irrigation  fluid was evacuated as much as possible.  The remaining trocars were  removed, and the pneumoperitoneum was released.   Skin incisions were closed with 4-0 Monocryl subcuticular stitches.  Steri-Strips and sterile dressings were applied.  She tolerated the  procedure without any apparent complications and was taken to recovery  in satisfactory condition.      Adolph Pollack, M.D.  Electronically Signed     TJR/MEDQ  D:  12/26/2007  T:  12/27/2007  Job:  [id number]   cc:   Bevelyn Buckles. Bensimhon, MD  Eugenio Hoes. Tawanna Cooler, MD

## 2011-01-02 NOTE — Assessment & Plan Note (Signed)
[facility]                         GASTROENTEROLOGY OFFICE NOTE   NAME:HASKELLJaniece, Scovill                       MRN:          [id number]  DATE:04/08/2007                            DOB:          1946-05-31    This is a return office visit for worsening reflux symptoms with solid  food dysphagia and small volume hematochezia.  Mrs. Verret has had  chronic GERD with a history of a peptic stricture dilated in May 2007.  She unfortunately has come off proton pump inhibitors completely; her  symptoms returned and she was advised to use Prilosec OTC, which did not  substantially improve her symptoms, and she was changed ranitidine 150  mg b.i.d. by her primary care physician with some modest improvement in  symptoms.  She still has frequent breakthrough symptoms and ongoing  solid food dysphagia.  She has noted several occasions with a small  amount of bright red blood per rectum associated with bowel movements.  She notes no weight loss, odynophagia, change in bowel habits or change  in stool caliber.  There is no family history of colon cancer, colon  polyps or inflammatory bowel disease.   PAST MEDICAL HISTORY:  See November 30, 2005 note, no changes.   CURRENT MEDICATIONS:  Listed on the chart, updated and reviewed.   MEDICATION ALLERGIES:  PENICILLIN, ERYTHROMYCIN and CODEINE.   PHYSICAL EXAMINATION:  Well-developed, well-nourished, overweight, in no  acute distress.  Weight 243.  Blood pressure 116/60, pulse 56 and regular.  HEENT:  Anicteric sclerae.  Oropharynx clear.  CHEST:  Clear to auscultation bilaterally.  CARDIAC:  Regular rate and rhythm without murmurs appreciated.  ABDOMEN:  Soft, nontender and non-distended.  Normoactive bowel sounds.  No palpable organomegaly, masses or hernias.  RECTAL:  Examination deferred to time of colonoscopy.   ASSESSMENT AND PLAN:  1. Refractory reflux symptoms with solid food dysphagia.  She is      advised  to maintain a prescription-strength proton pump inhibitor      indefinitely.  Begin pantoprazole 40 mg p.o. q.a.m. along with      standard antireflux measures.  Discontinue ranitidine.  Risks,      benefits and alternatives to upper endoscopy with possible biopsy      and possible dilation discussed with the patient and she consented      to proceed; this will be scheduled electively.  2. Small volume hematochezia.  Rule out colorectal neoplasms,      hemorrhoids and other disorders.  Risks, benefits and alternatives      to colonoscopy with possible biopsy, possible polypectomy and      possible destruction of internal hemorrhoids were discussed with      the      patient and she consents to proceed; this will be scheduled      electively at the time of the upper endoscopy.     Venita Lick. Russella Dar, MD, [facility]  Electronically Signed    MTS/MedQ  DD: 04/08/2007  DT: 04/09/2007  Job #: [id number]   cc:   Tinnie Gens A. Tawanna Cooler, MD

## 2011-01-02 NOTE — Consult Note (Signed)
[name], [name]              ACCOUNT NO.:  [id number]   MEDICAL RECORD NO.:  [id number]          PATIENT TYPE:  INP   LOCATION:  3740                         FACILITY:  [facility]   PHYSICIAN:  Revonda Standard L. Rennis Harding, N.P. DATE OF BIRTH:  May 05, 1946   DATE OF CONSULTATION:  DATE OF DISCHARGE:                                 CONSULTATION   TIME OF CONSULTATION:  11:20 a.m.   CONSULTING SURGEON:  Adolph Pollack, MD.   REQUESTING PHYSICIAN:  Bevelyn Buckles. Bensimhon, MD.   PRIMARY CARE PHYSICIAN:  Eugenio Hoes. Tawanna Cooler, MD.   REASON FOR CONSULTATION:  Acute cholecystitis and recurrent biliary  colic.   HISTORY OF PRESENT ILLNESS:  [name] is a 61-year female patient  with history of reported chronic back pain, osteoarthritis, obesity,  sleep apnea prior DVT x2 after recent knee surgery in December 2008,  recently stopped Coumadin.  The patient was admitted to the hospital on  Dec 23, 2007, after developing severe chest pain while undergoing  epidural injection for her chronic back pain.  Her cardiac workup has  been negative.  She does have some mild cardiomegaly on her  echocardiogram.  She has had nausea and vomiting with this chest pain  and has actually been more tender in the right upper quadrant.  The pain  was described as level 10/10.  She had an ultrasound of the abdomen done  on Dec 24, 2007, which revealed a 2.7 cm gallstone, otherwise the  ultrasound was negative.  No sonographic Murphy sign.  When the patient  was admitted, her white cell count and her LFTs were normal.  Today, the  patient has had somewhat of an increasing pain and relieved with  Vicodin.  Her white count is increased to 14,700.  LFTs are pending.  Cardiology is concerned that the patient may have acute cholecystitis  and has requested surgical consultation for possible cholecystectomy.   REVIEW OF SYSTEMS:  CONSTITUTIONAL:  No fevers.  No chills.  No  myalgias.  RESPIRATORY:  The patient has CPAP ordered for  sleep apnea.  She does not use it consistently.  CARDIOVASCULAR:  The patient does  have chronic lower extremity edema, which requires b.i.d. Lasix.  GI:  The patient has chronic reflux and indigestion symptoms, has worsened  over the past 1-2 months.  She has had epigastric and right upper  quadrant pain that has radiated into the right scapula and has been  causing back pain.   FAMILY MEDICAL HISTORY:  Noncontributory.   SOCIAL HISTORY:  No alcohol.  No tobacco.  No illicit drugs.  She is  married.  Her husband was in the room during the exam.  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   PAST MEDICAL HISTORY:  1. Chronic back pain.  2. Hypertension.  3. Osteoarthritis.  4. Hiatal hernia and GERD.  5. Dyslipidemia.  6. Recent DVT after total knee replacement, left knee in December      2008.  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  7. Morbid obesity.  8. Obstructive sleep apnea on the CPAP periodic use of CPAP, diagnosed      in 2006.  9. Restless leg syndrome.   PAST SURGICAL HISTORY:  1. Left TKR in December 2008.  2. Supraumbilical ventral hernia repair by Dr. Orson Slick, remote.  3. Appendectomy.  4. Hysterectomy.  5. Prior right shoulder surgery.   ALLERGIES:  Include LATEX, CODEINE, PENICILLIN, and ERYTHROMYCIN.   CURRENT MEDICATIONS:  1. Aspirin.  2. Prozac.  3. ReQuip.  4. Protonix.  5. Lisinopril.  6. Potassium.  7. Lasix.  8. Sliding scale insulin.  9. IV heparin.  She is also on IV nitroglycerin and Vicodin.  The      patient notes that she had been on Coumadin up until 2 weeks ago.   PHYSICAL EXAMINATION:  CONSTITUTIONAL:  GENERAL:  Pleasant female  patient with mild right upper quadrant pain eating a bacon, eggs, and  tomato sandwich, one-half of the sandwich has been eaten.  VITAL SIGNS:   Temperature 97.3, BP 130/69, pulse 50, respirations 18.  Sclerae nonicteric.  Conjunctivae are pink.  The pupils are equal, react  to light.  EARS, NOSE, MOUTH, and THROAT:  Ears are symmetrical in appearance.  Nose is midline.  No drainage.  Oral mucous membranes are pink and  moist.  NECK:  Trachea midline.  Thyroid is nonpalpable.  LUNGS:  Respiratory effort is normal.  Bilateral lung sounds are clear  to auscultation.  CARDIOVASCULAR:  Heart sounds are S1 and S2.  No rubs, murmurs, or  gallops.  Chronic peripheral edema of the lower extremities, nonpitting.  Telemetry demonstrates sinus brady to sinus rhythm.  Pulses are easily  palpable, 1+ radial, pedal, and carotid.  GASTROINTESTINAL:  Abdomen is obese and soft.  Bowel sounds are present.  Abdomen is tender mildly in the right upper quadrant with minimal  guarding.  No obvious hepatosplenomegaly, masses, or bruits.  No  recurrence of supraumbilical ventral hernia palpated.  MUSCULOSKELETAL:  Extremities are symmetrical in appearance without  clubbing or cyanosis.  SKIN:  Normal in appearance without any concerning rashes, lesions,  masses, moles, or scars grossly.  NEUROLOGIC:  Cranial nerves II-XII are intact.  EXTREMITIES:  Sensation is intact in the upper and lower extremities  bilaterally.  Please note that the patient has well-healed anterior left  knee scar.  PSYCH:  The patient is oriented to time, person, place, and situation.  Her affect is appropriate to current situation.   LAB:  BNP is 160, sodium 143, potassium 2.9, CO2 of 29, glucose 130, BUN  23, creatinine 1.11, white count is up to 14,700, previously was 10,900,  hemoglobin 11.8, platelets to 269,000.  LFTs today they are pending.  Upon admission they were normal.  Diagnostics ultrasound of the abdomen  as described.   IMPRESSION:  1. Biliary colic.  2. Cholelithiasis.  3. Leukocytosis and probable acute cholecystitis.  4. Obstructive sleep apnea, on CPAP  at home.  5. History of recent deep venous thrombosis x2 after left total knee      replacement on December 2008.  6. Hypertension and mild cardiomegaly.   PLAN:  1. N.p.o. and then we can proceed with the OR today, given the fact      that she has just eaten, we will not be able to proceed for OR  until 5:00 p.m.  If I unable to proceed with OR today, start low-      fat full liquids and n.p.o. after midnight.  2. Continue IV heparin, given the patient's history of recurrent DVT      postoperative, which is quite recent.  Once OR time is determined,      we will stop the heparin at least for possibly 6 hours preop.  3. Add IV Cipro to cover enteric pathogens for biliary colic and      biliary cholecystitis.  4. Add IV morphine.  The patient reports Vicodin has not given      adequate pain control.  5. Follow up on recent LFTs, amylase and lipase, rule out      choledocholithiasis or biliary pancreatitis.      Allison L. Rennis Harding, N.P.     ALE/MEDQ  D:  12/25/2007  T:  12/26/2007  Job:  [id number]   cc:   Bevelyn Buckles. Bensimhon, MD  Eugenio Hoes. Tawanna Cooler, MD

## 2011-01-02 NOTE — H&P (Signed)
[name], ODOR              ACCOUNT NO.:  [id number]   MEDICAL RECORD NO.:  [id number]          PATIENT TYPE:  INP   LOCATION:  3740                         FACILITY:  [facility]   PHYSICIAN:  Bevelyn Buckles. Bensimhon, MDDATE OF BIRTH:  07-24-1946   DATE OF ADMISSION:  12/23/2007  DATE OF DISCHARGE:                              HISTORY & PHYSICAL   PRIMARY CARDIOLOGIST:  Formerly Dr. Nanetta Batty.  The patient wishes  to transfer to [facility], and is now being seen by Dr. Arvilla Meres.   PRIMARY CARE PHYSICIAN:  Tinnie Gens A. Tawanna Cooler, MD   HISTORY OF PRESENT ILLNESS:  This is a 66 year old obese Caucasian  female who was being followed by Dr. Murray Hodgkins for pain management of  chronic back pain and had been having an epidural injection today.  During the injection, the patient has had a metallic taste in her mouth,  did not feel right.  She had severe weakness and then severe chest pain,  which she described as tightness 9/10 with associated shortness of  breath.  She became pale, diaphoretic, and nauseated and had some dry  heaves.  The patient also became mildly hypotensive with a systolic  blood pressure of 55.  She was started on IV fluids and brought to the  emergency room.  The patient continues uncomfortable in the emergency  room.  The pain has been ongoing since 3:30 p.m.  She came to the ER via  ambulance and was started on nitroglycerin drip.  Her blood pressure at  that time was 115 systolic.  The patient is currently on oxygen and  having some discomfort in her chest, although it is not severe.  The  patient had had in the past a cardiac catheterization 10 years ago,  which she said was normal and she had a recent Cardiolite stress test in  2004 through Dr. Hazle Coca office, which was negative for ischemia.   REVIEW OF SYSTEMS:  Positive for chest pain, shortness of breath,  nausea, vomiting, diaphoresis, and hypotension.   PAST MEDICAL HISTORY:  1. Chronic back  pain.  2. Hypertension.  3. Arthritis.  4. Hiatal hernia.  5. Restless leg syndrome.  6. Hypercholesterolemia.  7. DVT.  8. Obstructive sleep apnea.  9. Morbid obesity.   Of note, the patient had been on Coumadin secondary to history of DVT,  status post total knee but that has been discontinued x2 weeks.   PAST SURGICAL HISTORY:  1. Total left knee.  2. Umbilical hernia repair.  3. Appendectomy.  4. Hysterectomy.  5. Right soldiers surgery.   Most recent echocardiogram done in 2004, revealed LVEF of 55-65%.   SOCIAL HISTORY:  She lives in [place] with her husband.  She is an Charity fundraiser  for [employer].  She is married with one son and one daughter,  both of which are nurses.  She does not smoke, does not drink alcohol.  No drug use.   FAMILY HISTORY:  Mother with uterine and breast cancer.  Father with MI  and stent.  She has one sister in good health, and one brother  with  hypertension and obesity.   CURRENT MEDICATIONS:  At home:  1. Lisinopril 20 mg b.i.d.  2. Lasix 40 mg b.i.d.  3. Prozac 20 mg daily.  4. Allopurinol daily.  5. Protonix 40 mg daily.  6. ReQuip at bedtime.  7. Multivitamin.  8. Vicodin p.r.n.   ALLERGIES:  PENICILLIN, CODEINE, ERYTHROMYCIN and cannot tolerate  STATINS.   CURRENT LABS:  Sodium 138, potassium 4.4, chloride 106, BUN 9,  creatinine 1.1, glucose 78, hemoglobin 12.9, hematocrit 38.0, CK 36.5,  MB 1.2, troponin 0.05.   PHYSICAL EXAM:  Blood pressure 139/84, pulse 72, respirations 18,  temperature 97.8, and O2 sat 98% on 2 liters.  HEENT: Head is normocephalic and atraumatic.  Eyes, PERRLA.  Mucous  membranes of mouth are pink and moist.  Tongue is midline.  NECK:  Supple.  There is no JVD.  No carotid bruits appreciated.  CARDIOVASCULAR: Regular rate and rhythm without murmurs, rubs, or  gallops.  LUNGS: Clear to auscultation.  ABDOMEN: Obese with positive Murphy's sign, severe tenderness on  palpation.  EXTREMITIES: Without  clubbing, cyanosis, or edema.  Radial pulses and  dorsalis pedis pulses are 1+ bilaterally.  SKIN: Warm and dry.  NEUROLOGIC: Intact.   Chest x-ray reveals no active lung disease.  EKG reveals sinus  bradycardia with ventricular rate of 69 beats per minute on admission.   IMPRESSION:  1. Chest pain.  2. Abdominal pain with positive Murphy sign.  3. History of hypertension.  4. History of depression.  5. Chronic back pain.  6. Hypercholesterolemia who is intolerant to statins.   Cardiovascular risk factors include hypertension, obesity,  hypercholesterolemia, and family history.   PLAN:  The patient has been seen and examined by myself and Dr.  Gala Romney, suspect this hypotension and bradycardia was a vagal response  but now with chest pain with positive Murphy's.  We will admit to rule  out MI, do right upper quadrant ultrasound.  If cardiac enzymes are  negative and chest pain is resolved,  we will plan an outpatient  Myoview.  She has had a normal Myoview 2 years ago. If cardiac enzymes  are positive,  we will plan cardiac catheterization.  If right upper  quadrant ultrasound is positive, we will refer to surgery.  We will  check LFTs in the interim.      Bettey Mare. Lyman Bishop, NP      Bevelyn Buckles. Bensimhon, MD  Electronically Signed    KML/MEDQ  D:  12/23/2007  T:  12/24/2007  Job:  [id number]   cc:   Tinnie Gens A. Tawanna Cooler, MD

## 2011-01-02 NOTE — Discharge Summary (Signed)
NAMEREBBIE, LAURICELLA              ACCOUNT NO.:  [id number]   MEDICAL RECORD NO.:  [id number]          PATIENT TYPE:  INP   LOCATION:  3740                         FACILITY:  [facility]   PHYSICIAN:  Gerrit Friends. Dietrich Pates, MD, FACCDATE OF BIRTH:  06-Jul-1946   DATE OF ADMISSION:  12/23/2007  DATE OF DISCHARGE:  12/27/2007                               DISCHARGE SUMMARY   PRIMARY CARDIOLOGIST:  Bevelyn Buckles. Bensimhon, MD   PRIMARY CARE PHYSICIAN:  Tinnie Gens A. Tawanna Cooler, MD   CONSULTING PHYSICIAN:  Adolph Pollack, M.D.   PROCEDURES PERFORMED DURING HOSPITALIZATION:  1. Lap chole completed by Dr. Abbey Chatters on Dec 25, 2007 secondary to      cholelithiasis and biliary colic.  2. Echocardiogram revealing normal LVEF of 60% to 65%.   DISCHARGE DIAGNOSES:  1. Biliary colic with cholelithiasis      a.     Status post lap chole.  2. Noncardiac chest pain.  3. Hypertension.  4. Morbid obesity.  5. Bradycardia, resolved off of Cartia.   HOSPITAL COURSE:  This is a pleasant 65 year old morbidly obese  Caucasian female who was seen by Dr. Murray Hodgkins for pain management in his  office and was having epidural injection at that time.  Post injection,  the patient began to feel a metallic taste in mouth and did not feel  right.  She has severe weakness, chest discomfort, tightness with  shortness of breath, pale, and diaphoretic, and nauseated, having some  dry heaves.  She also became mildly hypotensive in his office.  She was  started on IV fluids and brought to the emergency room.  The patient  states the pain has been ongoing since about 3:30 p.m. and was started  on a nitroglycerin drip.  The patient was seen and examined by myself  and Dr. Arvilla Meres.  On my exam, the patient did have positive  Murphy sign.  EKG was normal.  Point of care markers were negative for  acute ST-T wave changes.  The patient did have some cardiovascular risk  factors include hypertension, obesity, hypercholesterolemia,  and family  history.  We admitted the patient to rule out cardiac etiology of chest  discomfort.  The patient did have an echocardiogram completed, which  revealed normal EF, normal wall motion, and there was no evidence of  ischemic event with negative troponins x3.   We did schedule her for abdominal ultrasound to rule out cholelithiasis  and blocked biliary ducts secondary to positive Murphy sign and right  upper quadrant tenderness.  The patient did have a positive gallbladder  ultrasound revealing a single large gallstone freely mobile within the  gallbladder without evidence of obstruction sonographically.  Dr.  Abbey Chatters was consulted and the patient was scheduled for a lap chole.   The patient did undergo lap chole per Dr. Abbey Chatters on Dec 26, 2007  without incident.  The patient recovered well although, she was sore and  was maintained on pain medications.  The patient apparently had been on  Cartia as an outpatient and this was discontinued.  The patient's blood  pressure was well controlled on  lisinopril 20 mg twice a day.  On day of  discharge, the patient was seen by Dr. Abbey Chatters and by Dr. Dietrich Pates  and found to be stable for discharge.  She will return home and follow  up with her primary care physician and Dr. Abbey Chatters as an outpatient.  The patient will not need to have any further cardiac workup at this  time.   DISCHARGE LABS:  Hemoglobin A1c 5.5, sodium 143, potassium 3.9, chloride  106, CO2 29, glucose 130, BUN 23, and creatinine 1.1.  Hemoglobin 11.8,  hematocrit 35.6, white blood cells 40.2, and platelets 269.  TSH 0.954,  cholesterol 223, lipids 103, HDL 30, and LDL 172.   DISCHARGE VITAL SIGNS:  Blood pressure 130/65, heart rate 60, and  respirations 18.   DISCHARGE MEDICATIONS:  1. Lisinopril 20 mg twice a day.  2. Lasix 40 mg twice a day.  3. Prozac 20 mg daily.  4. Allopurinol 150 mg daily.  5. Protonix 40 mg daily.  6. ReQuip 0.5 mg at  bedtime.  7. Vicodin 10/325 q.6 h p.r.n. back pain.  8. Tylox 1 to 2 tablets q.4 h p.r.n. surgical pain.   ALLERGIES:  PENICILLIN, CODEINE, ERYTHROMYCIN and STATIN'S.   FOLLOWUP PLANS AND APPOINTMENT:  1. The patient is to call Dr. Abbey Chatters on her own accord for      followup appointment within 3 weeks.  2. The patient is to follow up with her primary care physician for      continued medical management.  3. The patient is to follow up post surgical incision site care as      outlined by Dr. Abbey Chatters.   TIME SPENT WITH THE PATIENT TO INCLUDE PHYSICIAN TIME:  30 minutes.      Bettey Mare. Lyman Bishop, NP      Gerrit Friends. Dietrich Pates, MD, [facility]  Electronically Signed    KML/MEDQ  D:  12/27/2007  T:  12/27/2007  Job:  [id number]   cc:   Tinnie Gens A. Tawanna Cooler, MD  Adolph Pollack, M.D.

## 2011-01-05 NOTE — Procedures (Signed)
NAMEIMARI, REEN NO.:  [id number]   MEDICAL RECORD NO.:  [id number]          PATIENT TYPE:  OUT   LOCATION:  SLEEP CENTER                 FACILITY:  [facility]   PHYSICIAN:  Clinton D. Maple Hudson, M.D. DATE OF BIRTH:  15-Oct-1945   DATE OF STUDY:  09/12/2004                              NOCTURNAL POLYSOMNOGRAM   REFERRING PHYSICIAN:  Jetty Duhamel, MD   INDICATIONS FOR STUDY:  Hypersomnia with sleep apnea. Epworth sleepiness  score 11/24, BMI 42, weight 246 pounds.   SLEEP ARCHITECTURE:  Total sleep time 331 minutes with sleep efficiency of  76%. Stage I was 13%, stage II was 6%, stages III and IV were 13%, REM was  11% of total sleep time. Latency to sleep onset 47 minutes. Latency to REM  302 minutes. Awake after sleep onset 60 minutes. Arousal index 28. The  patient needed an extra blanket and a sleeping wedge for comfort, reportedly  usually sleeps propped up.   RESPIRATORY DATA:  RDI 16.5 obstructive events per hour indicating mild to  moderate obstructive sleep apnea/hypopnea syndrome. This included 28  obstructive apneas and 68 hypopneas. The events were not positional. She did  not have enough early events to permit use of CPAP titration by split study  protocol.   OXYGEN DATA:  Mild to moderate intermittent snoring with oxygen desaturation  to a nadir of 84%. Mean oxygen desaturation through the night was 90% to 92%  on room air.   CARDIAC DATA:  Normal sinus rhythm.   MOVEMENT/PARASOMNIA:  A total of 23 limb jerks were recorded of which 4 were  associated with arousal or awakening for periodic limb movement with arousal  index of 0.7 per hour which is insignificant.   IMPRESSION/RECOMMENDATIONS:  1.  Mild to moderate obstructive sleep apnea/hypopnea syndrome, RDI of 16.5      per hour, with oxygen desaturation to a nadir of 84%, and light moderate      snoring.  2.  Note relatively low mean oxygen saturation of 90% to 92% through the      study.  3.   Consider trial of CPAP or evaluate for alternative therapies as      appropriate.      CDY/MEDQ  D:  10/01/2004 09:23:21  T:  10/01/2004 18:36:03  Job:  [id number]

## 2011-01-05 NOTE — Op Note (Signed)
[facility]  Patient:    [name], [name] Visit Number: [id number] MRN: [id number]          Service Type: DSU Location: DAY Attending Physician:  Meredith Leeds Dictated by:   Zigmund Daniel, M.D. Proc. Date: 11/04/01 Admit Date:  11/04/2001                             Operative Report  PREOPERATIVE DIAGNOSIS:  Umbilical hernia.  POSTOPERATIVE DIAGNOSIS:  Umbilical hernia.  OPERATION PERFORMED:  Repair of umbilical hernia.  SURGEON:  Zigmund Daniel, M.D.  ANESTHESIA:  General and local.  DESCRIPTION OF PROCEDURE:  After the patient was monitored and anesthetized and had routine preparation and draping of the abdomen, I liberally infused long-acting local anesthetic around the umbilicus and in the deep tissues there.  I made downwardly curved transverse incision just above the umbilicus and dissected down through the fat until I encountered the hernia.  I separated it from the umbilical skin and the normal subcutaneous fat and dissected it down to the fascia and loosened its attachments to the fascia. The defect was about 1.5 cm to 2 cm in size.  I freed up tissues underneath it a little bit and found that the sac was thin but was was not injured and that the contents of the hernia were either entirely preperitoneal fat or omentum. I placed a small patch of polypropylene mesh cut to about 3 cm in all dimensions underneath the fascia and spread it out.  I loosened the subcutaneous tissues above the fascia to provide a little mobility, then I primarily closed the defect with running transverse suture of 0 Prolene incorporating the mesh with several sutures in the middle to hold it in place and reinforce the repair.  Hemostasis was good.  I reduced the subcutaneous space by suturing the midpart of the umbilical skin down to the deeper subcutaneous tissues and then I closed the skin with intercuticular 4-0 Vicryl and Steri-Strips and  applied a bulky bandage.  The patient tolerated the procedure well. Dictated by:   Zigmund Daniel, M.D. Attending Physician:  Meredith Leeds DD:  11/04/01 TD:  11/04/01 Job: [id number]
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
## 2011-05-16 LAB — POCT I-STAT, CHEM 8
BUN: 22
Calcium, Ion: 1.03 — ABNORMAL LOW
Hemoglobin: 11.9 — ABNORMAL LOW
Sodium: 140
TCO2: 24

## 2011-05-16 LAB — CARDIAC PANEL(CRET KIN+CKTOT+MB+TROPI)
Relative Index: INVALID
Total CK: 60
Troponin I: 0.02

## 2011-05-16 LAB — CK TOTAL AND CKMB (NOT AT ARMC): Total CK: 47

## 2011-05-16 LAB — APTT: aPTT: 28

## 2011-05-16 LAB — COMPREHENSIVE METABOLIC PANEL
ALT: 13
BUN: 18
Calcium: 8.6
Glucose, Bld: 159 — ABNORMAL HIGH
Sodium: 139
Total Protein: 5.9 — ABNORMAL LOW

## 2011-05-16 LAB — CBC
HCT: 35.6 — ABNORMAL LOW
HCT: 35.7 — ABNORMAL LOW
Hemoglobin: 11.8 — ABNORMAL LOW
Hemoglobin: 12.2
MCHC: 34.3
MCV: 85.6
MCV: 86.2
Platelets: 269
Platelets: 295
RBC: 4.02
RDW: 14.4
WBC: 13 — ABNORMAL HIGH
WBC: 14.2 — ABNORMAL HIGH

## 2011-05-16 LAB — TSH: TSH: 0.954

## 2011-05-16 LAB — POCT CARDIAC MARKERS
CKMB, poc: <1 — ABNORMAL LOW
CKMB, poc: <1 — ABNORMAL LOW
Myoglobin, poc: 93.6
Operator id: 231701
Troponin i, poc: 0.11 — ABNORMAL HIGH

## 2011-05-16 LAB — D-DIMER, QUANTITATIVE: D-Dimer, Quant: 0.63 — ABNORMAL HIGH

## 2011-05-16 LAB — BASIC METABOLIC PANEL
BUN: 23
Chloride: 106
Glucose, Bld: 130 — ABNORMAL HIGH
Potassium: 3.9

## 2011-05-16 LAB — URINALYSIS, ROUTINE W REFLEX MICROSCOPIC
Glucose, UA: NEGATIVE
Nitrite: NEGATIVE
Specific Gravity, Urine: 1.017
pH: 5.5

## 2011-05-16 LAB — BLOOD GAS, ARTERIAL
Drawn by: 131
pH, Arterial: 7.418 — ABNORMAL HIGH

## 2011-05-16 LAB — TROPONIN I: Troponin I: 0.01

## 2011-05-16 LAB — PROTIME-INR: Prothrombin Time: 13.5

## 2011-05-16 LAB — URINE MICROSCOPIC-ADD ON

## 2011-05-16 LAB — LIPID PANEL
Total CHOL/HDL Ratio: 7.4
VLDL: 21

## 2011-05-16 LAB — AMYLASE: Amylase: 20 — ABNORMAL LOW

## 2011-05-16 LAB — HEPATIC FUNCTION PANEL
AST: 12
Albumin: 3.1 — ABNORMAL LOW
Total Bilirubin: 0.5
Total Protein: 5.9 — ABNORMAL LOW

## 2011-05-16 LAB — B-NATRIURETIC PEPTIDE (CONVERTED LAB): Pro B Natriuretic peptide (BNP): 160 — ABNORMAL HIGH

## 2011-05-16 LAB — HEPARIN LEVEL (UNFRACTIONATED): Heparin Unfractionated: 0.61

## 2011-05-16 LAB — CK: Total CK: 51

## 2011-07-23 ENCOUNTER — Ambulatory Visit (INDEPENDENT_AMBULATORY_CARE_PROVIDER_SITE_OTHER): Payer: Medicare Other | Admitting: Gastroenterology

## 2011-07-23 ENCOUNTER — Encounter: Payer: Self-pay | Admitting: Gastroenterology

## 2011-07-23 VITALS — BP 112/50 | HR 80 | Ht 64.0 in | Wt 247.0 lb

## 2011-07-23 DIAGNOSIS — R1319 Other dysphagia: Secondary | ICD-10-CM

## 2011-07-23 DIAGNOSIS — K219 Gastro-esophageal reflux disease without esophagitis: Secondary | ICD-10-CM

## 2011-07-23 MED ORDER — PANTOPRAZOLE SODIUM 40 MG PO TBEC: 40.0000 mg | DELAYED_RELEASE_TABLET | Freq: Every day | ORAL | Status: DC

## 2011-07-23 NOTE — Patient Instructions (Signed)
You have been scheduled for a Upper Endoscopy Savary with propofol. See separate instructions. Stop taking omeprazole and start pantoprazole 40 mg one tablet by mouth daily. A prescription has been sent to the pharmacy.  cc: Blane Ohara, MD

## 2011-07-23 NOTE — Progress Notes (Signed)
History of Present Illness: This is a 65 year old female complaining of worsening reflux symptoms and dysphagia to solids and liquids. She has postprandial reflux symptoms as well as nocturnal symptoms. She has been treated with pantoprazole in the past and she feels this has been more effective than omeprazole. She underwent endoscopy with esophageal dilation in May 2010. Denies weight loss, abdominal pain, constipation, diarrhea, change in stool caliber, melena, hematochezia, nausea, vomiting, chest pain.  Current Medications, Allergies, Past Medical History, Past Surgical History, Family History and Social History were reviewed in [employer] record.  Physical Exam: General: Well developed , well nourished, obese, no acute distress Head: Normocephalic and atraumatic Eyes:  sclerae anicteric, EOMI Ears: Normal auditory acuity Mouth: No deformity or lesions Lungs: Clear throughout to auscultation Heart: Regular rate and rhythm; no murmurs, rubs or bruits Abdomen: Soft, non tender and non distended. No masses, hepatosplenomegaly or hernias noted. Normal Bowel sounds Musculoskeletal: Symmetrical with no gross deformities  Pulses:  Normal pulses noted Extremities: No clubbing, cyanosis, edema or deformities noted Neurological: Alert oriented x 4, grossly nonfocal Psychological:  Alert and cooperative. Normal mood and affect  Assessment and Recommendations:  1. GERD and dysphagia. 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 Discontinue omeprazole and begin pantoprazole 40 mg daily. Schedule endoscopy with dilation. The risks, benefits, and alternatives to endoscopy with possible biopsy and possible dilation were discussed with the patient and they consent to proceed.   2. Personal history of adenomatous colon polyps. Surveillance colonoscopy recommended October 2013.

## 2011-08-21 DIAGNOSIS — K2270 Barrett's esophagus without dysplasia: Secondary | ICD-10-CM

## 2011-08-21 DIAGNOSIS — F5102 Adjustment insomnia: Secondary | ICD-10-CM | POA: Insufficient documentation

## 2011-08-21 HISTORY — DX: Barrett's esophagus without dysplasia: K22.70

## 2011-08-28 ENCOUNTER — Encounter: Payer: Self-pay | Admitting: Gastroenterology

## 2011-08-28 ENCOUNTER — Ambulatory Visit (AMBULATORY_SURGERY_CENTER): Payer: Medicare Other | Admitting: Gastroenterology

## 2011-08-28 DIAGNOSIS — R1319 Other dysphagia: Secondary | ICD-10-CM

## 2011-08-28 DIAGNOSIS — K2270 Barrett's esophagus without dysplasia: Secondary | ICD-10-CM

## 2011-08-28 DIAGNOSIS — K219 Gastro-esophageal reflux disease without esophagitis: Secondary | ICD-10-CM

## 2011-08-28 MED ORDER — SODIUM CHLORIDE 0.9 % IV SOLN: 500.0000 mL | INTRAVENOUS | Status: DC

## 2011-08-28 NOTE — Patient Instructions (Addendum)
Please follow the ESOPHAGEAL DILATION DIET as follows:  -Nothing by mouth until 12:00pm  -Clear Liquids for 1 hour 12:00pm-1:00pm  -Soft foods for the rest of today 1:00pm until tomorrow

## 2011-08-28 NOTE — Op Note (Signed)
[facility] [address] [place], [place]  [address]  ENDOSCOPY PROCEDURE REPORT PATIENT:  [name], [name]  MR#:  [id number] BIRTHDATE:  [birth year], 65 yrs. old  GENDER:  female ENDOSCOPIST:  Judie Petit T. Russella Dar, MD, [facility]  PROCEDURE DATE:  08/28/2011 PROCEDURE:  EGD with Savary dilation over a guidewire and with biopsy ASA CLASS:  Class II INDICATIONS:  dysphagia, GERD MEDICATIONS:  MAC sedation, administered by CRNA, propofol (Diprivan) 200 mg IV TOPICAL ANESTHETIC:  none DESCRIPTION OF PROCEDURE:   After the risks benefits and alternatives of the procedure were thoroughly explained, informed consent was obtained.  The LB [id number] endoscope was introduced through the mouth and advanced to the second portion of the duodenum, without limitations.  The instrument was slowly withdrawn as the mucosa was fully examined. <<PROCEDUREIMAGES>> Irregular Z-line at the gastroesophageal junction. Multiple biopsies were obtained and sent to pathology. Savary / guidewire dilation with a 17mm dilator with no resistance and minimal heme. The esophagus and gastroesophageal junction were otherwise completely normal in appearance. The stomach was entered and closely examined. The pylorus, antrum, angularis, and lesser curvature were well visualized, including a retroflexed view of the cardia and fundus. The stomach wall was normally distensable. The scope passed easily through the pylorus into the duodenum. The duodenal bulb was normal in appearance, as was the postbulbar duodenum.  Retroflexed views revealed a hiatal hernia. small.  The scope was then withdrawn from the patient and the procedure completed.  COMPLICATIONS:  None  ENDOSCOPIC IMPRESSION: 1) Irregular Z-line at the gastroesophageal junction 2) Small hiatal hernia  RECOMMENDATIONS: 1) Anti-reflux regimen long term 2) Await pathology results 3) Post dilation instructions 4) PPI qam long term  Jayvion Stefanski T.  Russella Dar, MD, [facility]  CC:  Blane Ohara, MD  n. Rosalie DoctorVenita Lick. Agustin Swatek at 08/28/2011 10:58 AM  Karle Plumber, [phone]

## 2011-08-28 NOTE — Progress Notes (Signed)
Patient did not experience any of the following events: a burn prior to discharge; a fall within the facility; wrong site/side/patient/procedure/implant event; or a hospital transfer or hospital admission upon discharge from the facility. (G8907) Patient did not have preoperative order for IV antibiotic SSI prophylaxis. (G8918)  

## 2011-08-29 ENCOUNTER — Telehealth: Payer: Self-pay | Admitting: *Deleted

## 2011-08-29 NOTE — Telephone Encounter (Signed)

## 2011-09-03 ENCOUNTER — Encounter: Payer: Self-pay | Admitting: Gastroenterology

## 2012-03-05 ENCOUNTER — Encounter: Payer: Self-pay | Admitting: Gastroenterology

## 2012-03-05 ENCOUNTER — Ambulatory Visit (INDEPENDENT_AMBULATORY_CARE_PROVIDER_SITE_OTHER): Payer: Medicare Other | Admitting: Gastroenterology

## 2012-03-05 VITALS — BP 120/68 | HR 80 | Ht 64.0 in | Wt 261.4 lb

## 2012-03-05 DIAGNOSIS — Z860100 Personal history of colon polyps, unspecified: Secondary | ICD-10-CM

## 2012-03-05 DIAGNOSIS — K219 Gastro-esophageal reflux disease without esophagitis: Secondary | ICD-10-CM

## 2012-03-05 DIAGNOSIS — R1319 Other dysphagia: Secondary | ICD-10-CM

## 2012-03-05 MED ORDER — PANTOPRAZOLE SODIUM 40 MG PO TBEC: 40.0000 mg | DELAYED_RELEASE_TABLET | Freq: Two times a day (BID) | ORAL | Status: DC

## 2012-03-05 NOTE — Progress Notes (Signed)
History of Present Illness: This is a 66 year old female who relates worsening lower substernal burning and occasional choking with swallowing liquids and solids. Symptoms have been active for the past 6 weeks. She underwent upper endoscopy in January 2013 which revealed a hiatal hernia, irregular Z line and no stricture. An empiric esophageal dilation was performed. She was changed from omeprazole to pantoprazole at that time.  Current Medications, Allergies, Past Medical History, Past Surgical History, Family History and Social History were reviewed in [employer] record.  Physical Exam: General: Well developed , well nourished, obese, no acute distress Head: Normocephalic and atraumatic Eyes:  sclerae anicteric, EOMI Ears: Normal auditory acuity Mouth: No deformity or lesions Lungs: Clear throughout to auscultation Heart: Regular rate and rhythm; no murmurs, rubs or bruits Abdomen: Soft, non tender and non distended. No masses, hepatosplenomegaly or hernias noted. Normal Bowel sounds Musculoskeletal: Symmetrical with no gross deformities  Pulses:  Normal pulses noted Extremities: No clubbing, cyanosis, edema or deformities noted Neurological: Alert oriented x 4, grossly nonfocal Psychological:  Alert and cooperative. Normal mood and affect  Assessment and Recommendations:  1. GERD and dysphagia. I suspect she has reflux causing her current symptoms. Intensify all antireflux measures. Begin a weight loss program. At her endoscopy in January 2013 she did not have a peptic stricture however dilation was performed. Increase pantoprazole to 40 mg twice daily. If her symptoms do not respond consider a barium esophagram, endoscopy with dilation or changing to another PPI.    2. Personal history of adenomatous colon polyps. Surveillance colonoscopy recommended October 2013.

## 2012-03-05 NOTE — Patient Instructions (Addendum)
Increase taking your Protonix twice daily. A prescription has been sent to your pharmacy. Patient advised to avoid spicy, acidic, citrus, chocolate, mints, fruit and fruit juices.  Limit the intake of caffeine, alcohol and Soda.  Don't exercise too soon after eating.  Don't lie down within 3-4 hours of eating.  Elevate the head of your bed. Call back if your symptoms fail to improve.  cc: Mickey Farber, MD

## 2012-03-19 ENCOUNTER — Other Ambulatory Visit: Payer: Self-pay | Admitting: Gastroenterology

## 2012-03-19 DIAGNOSIS — K219 Gastro-esophageal reflux disease without esophagitis: Secondary | ICD-10-CM

## 2012-03-19 DIAGNOSIS — R1319 Other dysphagia: Secondary | ICD-10-CM

## 2012-03-19 MED ORDER — PANTOPRAZOLE SODIUM 40 MG PO TBEC: 40.0000 mg | DELAYED_RELEASE_TABLET | Freq: Two times a day (BID) | ORAL | Status: DC

## 2012-03-19 NOTE — Telephone Encounter (Signed)
Prescription sent

## 2012-04-15 ENCOUNTER — Encounter: Payer: Self-pay | Admitting: Gastroenterology

## 2012-07-08 ENCOUNTER — Telehealth: Payer: Self-pay | Admitting: Gastroenterology

## 2012-07-08 NOTE — Telephone Encounter (Signed)
Left message for patient to call back  

## 2012-07-10 NOTE — Telephone Encounter (Signed)
Patient was seen in the ER in [place] for CHF.  She states her only GI symptoms are dysphagia.  She was told that on CT scan they saw an ulcer and see her GI asap.  She was started on carafate to add to her BID protonix.  She will come in and see Dr. Russella Dar on 07/14/12.  She is asked to fax me the records directly.

## 2012-07-14 ENCOUNTER — Encounter: Payer: Self-pay | Admitting: Gastroenterology

## 2012-07-14 ENCOUNTER — Ambulatory Visit (INDEPENDENT_AMBULATORY_CARE_PROVIDER_SITE_OTHER): Payer: Medicare Other | Admitting: Gastroenterology

## 2012-07-14 VITALS — BP 122/78 | HR 72 | Ht 64.0 in | Wt 263.6 lb

## 2012-07-14 DIAGNOSIS — R933 Abnormal findings on diagnostic imaging of other parts of digestive tract: Secondary | ICD-10-CM

## 2012-07-14 DIAGNOSIS — K2270 Barrett's esophagus without dysplasia: Secondary | ICD-10-CM

## 2012-07-14 NOTE — Patient Instructions (Addendum)
Stop taking your Carafate.  Follow up as needed.  Mickey Farber, MD

## 2012-07-14 NOTE — Progress Notes (Signed)
History of Present Illness: This is a 66 year old female who was evaluated at [facility] emergency department on September 11 for shortness of breath. She was felt to be in congestive heart failure. CT scan of the chest was performed which showed circumferential thickening of the distal esophagus just above the esophagogastric junction. Patient has known GERD and Barrett's esophagus. Her reflux symptoms have been under excellent control on pantoprazole 40 mg twice daily. Carafate 3 times daily was added by the EDP.  Current Medications, Allergies, Past Medical History, Past Surgical History, Family History and Social History were reviewed in [employer] record.  Physical Exam: General: Well developed , well nourished, obese, no acute distress Head: Normocephalic and atraumatic Eyes:  sclerae anicteric, EOMI Ears: Normal auditory acuity Mouth: No deformity or lesions Lungs: Clear throughout to auscultation Heart: Regular rate and rhythm; no murmurs, rubs or bruits Abdomen: Soft, non tender and non distended. No masses, hepatosplenomegaly or hernias noted. Normal Bowel sounds Musculoskeletal: Symmetrical with no gross deformities  Pulses:  Normal pulses noted Extremities: No clubbing, cyanosis, edema or deformities noted Neurological: Alert oriented x 4, grossly nonfocal Psychological:  Alert and cooperative. Normal mood and affect  Assessment and Recommendations:  1. Barrett's esophagus. Distal esophageal wall thickening is not unusual with chronic GERD. She underwent upper endoscopy January 2013. No need to repeat this evaluation. Discontinue Carafate. Continue pantoprazole 40 mg twice a day and standard antireflux measures. Surveillance endoscopy for Barrett's recommended at 3 years, January 2016.

## 2012-07-16 ENCOUNTER — Institutional Professional Consult (permissible substitution): Payer: Medicare Other | Admitting: Internal Medicine

## 2012-12-11 ENCOUNTER — Encounter: Payer: Self-pay | Admitting: Gastroenterology

## 2012-12-30 ENCOUNTER — Other Ambulatory Visit: Payer: Self-pay | Admitting: Gastroenterology

## 2013-07-13 ENCOUNTER — Ambulatory Visit (INDEPENDENT_AMBULATORY_CARE_PROVIDER_SITE_OTHER): Payer: Medicare Other | Admitting: Endocrinology

## 2013-07-13 ENCOUNTER — Encounter: Payer: Self-pay | Admitting: Endocrinology

## 2013-07-13 VITALS — BP 124/82 | HR 85 | Temp 98.4°F | Resp 14 | Ht 63.0 in | Wt 237.8 lb

## 2013-07-13 DIAGNOSIS — R61 Generalized hyperhidrosis: Secondary | ICD-10-CM

## 2013-07-13 DIAGNOSIS — E119 Type 2 diabetes mellitus without complications: Secondary | ICD-10-CM

## 2013-07-13 DIAGNOSIS — I10 Essential (primary) hypertension: Secondary | ICD-10-CM

## 2013-07-13 MED ORDER — CLONIDINE HCL 0.1 MG/24HR TD PTWK: 0.1000 mg | MEDICATED_PATCH | TRANSDERMAL | Status: DC

## 2013-07-13 NOTE — Progress Notes (Signed)
Patient ID: [name], female   DOB: 02/16/1946, 67 y.o.   MRN: [id number]  Chief complaint: Sweating episodes  History of Present Illness:  For about the last 8 months or so she has had episodes of profuse sweating. She gets sudden episode of sweating with dripping sweat from her head periodically. This appears to occur once or twice every day This does not involve her face, neck or trunk, in fact she tends to have less sweating in her or armpits She does not have any particular time when she has a sweating episode and is not necessarily related to ambient temperature or activity level She does not have any episodes during the night. She does not complain of heat intolerance  The sweating episodes are not associated with any flushing, headaches, palpitations, dizziness, difficulty breathing or diarrhea She was evaluated by her PCP with general lab including TSH which were normal and referred here for further management.  She has not been on hormone replacement therapy for several years    Past Medical History  Diagnosis Date  . Gout   . Diabetes mellitus, type 2   . GERD (gastroesophageal reflux disease)   . Myalgia and myositis   . Lung nodule   . Hiatal hernia   . Asthma   . Obstructive sleep apnea   . Ovarian cancer 2009  . Obesity   . Hyperlipidemia   . Hypertension   . Cholelithiasis   . Adenomatous polyps 05/2007  . Esophageal stricture   . Fibromyalgia   . Hemorrhoids   . Barrett's esophagus 08/2011    Past Surgical History  Procedure Laterality Date  . Tracheostomy    . Tonsillectomy    . Cholecystectomy    . Vaginal hysterectomy  2009    for ovarian tumor  . Replacement total knee  07/2005, 10/2010    bilateral    Family History  Problem Relation Age of Onset  . Breast cancer Mother   . Coronary artery disease Father   . Diabetes Father     Social History:  reports that she has never smoked. She has never used smokeless tobacco. She reports that  she does not drink alcohol or use illicit drugs.  Allergies:  Allergies  Allergen Reactions  . Aspirin     REACTION: bruising  . Codeine   . Erythromycin Ethylsuccinate     REACTION: rash  . Latex     REACTION: anaphlaxsis !!!!!!!  . Penicillins     REACTION: anaphylactic shock      Medication List       This list is accurate as of: 07/13/13  2:13 PM.  Always use your most recent med list.               allopurinol 300 MG tablet  Commonly known as:  ZYLOPRIM  Take 300 mg by mouth daily.     CENTRUM SILVER PO  Take 1 capsule by mouth daily.     COLACE 100 MG capsule  Generic drug:  docusate sodium  Take 100 mg by mouth daily.     cyclobenzaprine 5 MG tablet  Commonly known as:  FLEXERIL  Take 5 mg by mouth daily.     furosemide 80 MG tablet  Commonly known as:  LASIX  Take 80 mg by mouth 2 (two) times daily.     HYDROcodone-acetaminophen 7.5-750 MG per tablet  Commonly known as:  VICODIN ES  Take 1 tablet by mouth at bedtime as needed.  lisinopril 20 MG tablet  Commonly known as:  PRINIVIL,ZESTRIL  Take 20 mg by mouth daily.     LYRICA 150 MG capsule  Generic drug:  pregabalin  Take 150 mg by mouth 3 (three) times daily.     magnesium gluconate 500 MG tablet  Commonly known as:  MAGONATE  Take 500 mg by mouth 2 (two) times daily.     pantoprazole 40 MG tablet  Commonly known as:  PROTONIX  TAKE 1 TABLET TWICE A DAY     sucralfate 1 G tablet  Commonly known as:  CARAFATE  Take 1 g by mouth 3 (three) times daily.     traMADol 50 MG tablet  Commonly known as:  ULTRAM  Take 100 mg by mouth 3 (three) times daily.     ZETIA 10 MG tablet  Generic drug:  ezetimibe  Take 10 mg by mouth daily.        No visits with results within 1 Week(s) from this visit. Latest known visit with results is:  Procedure visit on 08/28/2011  Component Date Value Range Status  . Glucose-Capillary 08/28/2011 96  70 - 99 mg/dL Final     REVIEW OF SYSTEMS:           DIABETES: She has had mild diabetes and has not been on medications. Her A1c recently by PCP was 6.7 and lab glucose was 161 She checks her blood sugar weekly in the morning fasting and this is usually in the range of 88-100. Does not check readings after meals per She has tried to improve her diet and since early summer she has lost about 30 pounds     No unusual headaches.     Skin: No rash or infections     Thyroid:  No cold or heat intolerance, unusual fatigue.     His blood pressure has been  increased for about 20 years. Checking blood pressure about once a week, recent reading  115/80      Has had significant swelling of feet in the past.     She has had an episode of CHF of unclear etiology, treated by a cardiologist and is on high-dose Lasix      No shortness of breath on exertion.     Bowel habits:  No change recently. No diarrhea     She does not get hot flushes. She had hysterectomy at the age of 53 and was on hormone supplements only for about 10 years. Subsequently did not have any hot flushes        No frequency of urination, nocturia; no difficulty with bladder function or incontinence      No joint  Pains.    She has history of fibromyalgia for several years. Previously was on Lyrica and this was stopped about 3 months ago because of possible side effects And changed to Cymbalta    Left lower leg leg has an area of numbness on the outer part after her knee surgery. Otherwise has no numbness or tinging in her feet         She has history of depression, previously on Prozac, now on Cymbalta with good control of symptoms and mood   PHYSICAL EXAM:  BP 124/82  Pulse 85  Temp(Src) 98.4 F (36.9 C)  Resp 14  Ht 5\' 3"  (1.6 m)  Wt 237 lb 12.8 oz (107.865 kg)  BMI 42.13 kg/m2  SpO2 96%  GENERAL: She has generalized obesity. Does not have any facial plethora,  mood facies or any central obesity. Does have mild prominence of right supraclavicular fat pad only   No pallor, clubbing, lymphadenopathy or edema.   Skin:  no rash or pigmentation. No flushing, telangiectasia, nevi. Has thinning of the forearm skin  EYES:  Externally normal.  Fundii:  normal discs and vessels.  ENT: Oral mucosa and tongue normal.  THYROID:  Not palpable.  HEART:  Normal apex, S1 and S2; no murmur or click.  CHEST:  Normal shape and expansion.  Lungs:  Percussion normal.  Vescicular breath sounds heard equally.  No crepitations/ wheeze.  ABDOMEN:  No distention.  Liver and spleen not palpable.  No other mass or tenderness.  NEUROLOGICAL:  Monofilament sensation in feet is normal at the toes. Reflexes are normal bilaterally at ankles.  SPINE AND JOINTS:  Normal appearance.  No ankle edema present  ASSESSMENT:    She likely has idiopathic sweating episodes, not associated with typical hot flashes or any other associated symptoms like palpitations, headaches or increased blood pressure to suggest pheochromocytoma. No episodes of diarrhea or bronchospasm to suggest carcinoid. No headaches or other features of hypothalamic/pituitary dysfunction. TSH normal recently.  Diabetes with obesity, not well controlled on diet alone with A1c 6.7, probably has significant postprandial hyperglycemia which she is not checking. Glucose in the lab was 161. Currently is on fairly good diet  No obvious diabetes complications.  Has no features of peripheral or autonomic neuropathy  Hypertension, long-standing and well controlled, also does monitor weekly at home   PLAN:   Since she does not have any predisposing conditions causing sweating will empirically try clonidine. Because of potential side effects will have her try a weekly patch starting with the lowest dose. She has no contraindications for this or any history of bradyarrhythmia  Discussed usual side effects  As a precaution she will reduce her Prinivil to half a tablet and monitor blood pressure 2 or 3 times a week. She  will follow up in 4 weeks for review. Also may consider HRT empirically if symptoms not improved. This would be safe for it since the history of her cervical cancer is over 40 years ago  DIABETES: Advised her to check more readings after meals. If she is having consistent postprandial hyperglycemia may be a candidate for metformin.  Taleia Sadowski 07/13/2013, 2:13 PM

## 2013-07-13 NOTE — Patient Instructions (Addendum)
Clonidine patch 0.1mg  once a week  May lower BP, use 1/2 Lisinopril for now  Please check blood sugars at least half the time about 2 hours after any meal and as directed on waking up. Please bring blood sugar monitor to each visit

## 2013-08-10 ENCOUNTER — Ambulatory Visit (INDEPENDENT_AMBULATORY_CARE_PROVIDER_SITE_OTHER): Payer: Medicare Other | Admitting: Endocrinology

## 2013-08-10 ENCOUNTER — Encounter: Payer: Self-pay | Admitting: Endocrinology

## 2013-08-10 VITALS — BP 130/78 | HR 83 | Temp 98.1°F | Resp 12 | Ht 62.0 in | Wt 236.2 lb

## 2013-08-10 DIAGNOSIS — E119 Type 2 diabetes mellitus without complications: Secondary | ICD-10-CM

## 2013-08-10 DIAGNOSIS — R61 Generalized hyperhidrosis: Secondary | ICD-10-CM | POA: Insufficient documentation

## 2013-08-10 HISTORY — DX: Generalized hyperhidrosis: R61

## 2013-08-10 NOTE — Progress Notes (Signed)
Patient ID: [name], female   DOB: 11-28-1945, 67 y.o.   MRN: [id number]  Chief complaint: Sweating episodes  History of Present Illness:  She was seen in consultation in 9/14 for symptoms of episodes of profuse sweating. These had started in early May 14 and consisted of episodes of sweating with dripping sweat from her head periodically, occurring once or twice every day She was evaluated by her PCP with general labs including TSH which were normal  She had not been on hormone replacement therapy for several years  Because of her history of hypertension also she was given a trial of Catapres patch, 0.1 mg weekly and she was told to reduce her lisinopril to half tablet. With this her sweating episodes have gradually improved and she has not had any for the last 5 days She has had no side effects from the Catapres and has no drowsiness, dry mouth, dizziness. Also no local reaction to the patch Her cardiologist felt that she could stop her ACE inhibitor and continue the Catapres    Past Medical History  Diagnosis Date  . Gout   . Diabetes mellitus, type 2   . GERD (gastroesophageal reflux disease)   . Myalgia and myositis   . Lung nodule   . Hiatal hernia   . Asthma   . Obstructive sleep apnea   . Ovarian cancer 2009  . Obesity   . Hyperlipidemia   . Hypertension   . Cholelithiasis   . Adenomatous polyps 05/2007  . Esophageal stricture   . Fibromyalgia   . Hemorrhoids   . Barrett's esophagus 08/2011    Past Surgical History  Procedure Laterality Date  . Tracheostomy    . Tonsillectomy    . Cholecystectomy    . Vaginal hysterectomy  2009    for ovarian tumor  . Replacement total knee  07/2005, 10/2010    bilateral    Family History  Problem Relation Age of Onset  . Breast cancer Mother   . Coronary artery disease Father   . Diabetes Father     Social History:  reports that she has never smoked. She has never used smokeless tobacco. She reports that she does  not drink alcohol or use illicit drugs.  Allergies:  Allergies  Allergen Reactions  . Aspirin     REACTION: bruising  . Codeine   . Erythromycin Ethylsuccinate     REACTION: rash  . Latex     REACTION: anaphlaxsis !!!!!!!  . Penicillins     REACTION: anaphylactic shock      Medication List       This list is accurate as of: 08/10/13  1:20 PM.  Always use your most recent med list.               allopurinol 300 MG tablet  Commonly known as:  ZYLOPRIM  Take 300 mg by mouth daily.     CENTRUM SILVER PO  Take 1 capsule by mouth daily.     cloNIDine 0.1 mg/24hr patch  Commonly known as:  CATAPRES - Dosed in mg/24 hr  Place 0.1 mg onto the skin once a week.     cyclobenzaprine 5 MG tablet  Commonly known as:  FLEXERIL  Take 5 mg by mouth daily.     fenofibrate 160 MG tablet  Take 160 mg by mouth daily.     furosemide 80 MG tablet  Commonly known as:  LASIX  Take 80 mg by mouth 2 (two) times daily.  lisinopril 20 MG tablet  Commonly known as:  PRINIVIL,ZESTRIL  Take 20 mg by mouth daily.     magnesium gluconate 500 MG tablet  Commonly known as:  MAGONATE  Take 500 mg by mouth 2 (two) times daily.     pantoprazole 40 MG tablet  Commonly known as:  PROTONIX  TAKE 1 TABLET TWICE A DAY     ZETIA 10 MG tablet  Generic drug:  ezetimibe  Take 10 mg by mouth daily.        No visits with results within 1 Week(s) from this visit. Latest known visit with results is:  Procedure visit on 08/28/2011  Component Date Value Range Status  . Glucose-Capillary 08/28/2011 96  70 - 99 mg/dL Final     REVIEW OF SYSTEMS:          DIABETES: She has had mild diabetes and has not been on medications. Her A1c from PCP was 6.7 and lab glucose was 161 She thinks she can get better control now since she can be more active    PHYSICAL EXAM:  BP 130/78  Pulse 83  Temp(Src) 98.1 F (36.7 C)  Resp 12  Ht 5\' 2"  (1.575 m)  Wt 236 lb 3.2 oz (107.14 kg)  BMI 43.19  kg/m2  SpO2 98%   ASSESSMENT/PLAN:    Her idiopathic sweating episodes are dramatically improved with low-dose clonidine patch which she is tolerating. She will continue this and also followup with her PCP or cardiologist and the dose may be adjusted if needed. Followup here as needed  Diabetes: Currently not well controlled and she will try to work on lifestyle. However may be a candidate for metformin, deferred to PCP  Hypertension, long-standing and well controlled now with the clonidine, also does monitor  at home  [facility] 08/10/2013, 1:20 PM

## 2013-08-11 ENCOUNTER — Encounter: Payer: Self-pay | Admitting: Endocrinology

## 2013-09-15 ENCOUNTER — Encounter: Payer: Self-pay | Admitting: Physician Assistant

## 2013-09-15 ENCOUNTER — Ambulatory Visit (INDEPENDENT_AMBULATORY_CARE_PROVIDER_SITE_OTHER): Payer: Medicare Other | Admitting: Physician Assistant

## 2013-09-15 VITALS — BP 120/76 | Ht 62.0 in | Wt 235.2 lb

## 2013-09-15 DIAGNOSIS — R1310 Dysphagia, unspecified: Secondary | ICD-10-CM

## 2013-09-15 DIAGNOSIS — Z86010 Personal history of colonic polyps: Secondary | ICD-10-CM

## 2013-09-15 DIAGNOSIS — K219 Gastro-esophageal reflux disease without esophagitis: Secondary | ICD-10-CM

## 2013-09-15 MED ORDER — PANTOPRAZOLE SODIUM 40 MG PO TBEC: 40.0000 mg | DELAYED_RELEASE_TABLET | Freq: Two times a day (BID) | ORAL | Status: DC

## 2013-09-15 MED ORDER — MOVIPREP 100 G PO SOLR: 1.0000 | ORAL | Status: DC

## 2013-09-15 NOTE — Progress Notes (Signed)
Reviewed and agree with management plan.  Micheale Schlack T. Adilynne Fitzwater, MD FACG 

## 2013-09-15 NOTE — Progress Notes (Signed)
Subjective:    Patient ID: [name], female    DOB: [birth year], 68 y.o.   MRN: [id number]  HPI  [name] is a pleasant 68 year old white female known to Dr. Fuller Plan with history of chronic GERD and Barrett's esophagus . She also has history of hypertension, adult onset diabetes mellitus, asthma, obesity, and adenomatous colon polyps. Last EGD was done in January of 2013 showing a small hiatal hernia and an irregular Z line. She was empirically dilated with Savary to 17 mm. Biopsies were taken of the GE junction and these returned consistent with Barrett's esophagus without dysplasia. Last colonoscopy was done in 2008 with finding of 2 small polyps which were adenomatous and internal hemorrhoids. She is overdue for followup. Patient comes in today with complaints of recurrent heartburn and indigestion. She has been doing well on Protonix 40 mg by mouth twice daily but ran out within the past week due to a glitch   [facility] and says that her symptoms recur fairly quickly. She's been eating TUMS over the past few days. She also states that she's been having recurrent solid food dysphagia over the past 3-4 months. She does not eat mea, and ingeneral he has been avoiding salads and oatmeal etc. Because of  problems with dysphagia. Sh e says occasionally she'll have some choking but most of the time notices that her food seems to stop and she has to quit eating and wait for her food to go down. She has not been having any regurgitation . Says she has had some choking intermittently ever since she had a tracheostomy very remotely. She feels a prior esophageal dilations have a definitely helped her symptoms. She has no current lower GI complaints, specifically no constipation melena or hematochezia or abdominal pain.    Review of Systems  Constitutional: Negative.   HENT: Positive for trouble swallowing.   Eyes: Negative.   Respiratory: Positive for choking.   Cardiovascular: Negative.     Gastrointestinal: Negative.   Endocrine: Negative.   Genitourinary: Negative.   Musculoskeletal: Negative.   Allergic/Immunologic: Negative.   Neurological: Negative.   Hematological: Negative.   Psychiatric/Behavioral: Negative.    Outpatient Prescriptions Prior to Visit  Medication Sig Dispense Refill  . cloNIDine (CATAPRES - DOSED IN MG/24 HR) 0.1 mg/24hr patch Place 0.1 mg onto the skin once a week.      . ezetimibe (ZETIA) 10 MG tablet Take 10 mg by mouth daily.      . furosemide (LASIX) 80 MG tablet Take 80 mg by mouth 2 (two) times daily.      . magnesium gluconate (MAGONATE) 500 MG tablet Take 500 mg by mouth 2 (two) times daily.      . Multiple Vitamins-Minerals (CENTRUM SILVER PO) Take 1 capsule by mouth daily.        . pantoprazole (PROTONIX) 40 MG tablet TAKE 1 TABLET TWICE A DAY  180 tablet  2  . allopurinol (ZYLOPRIM) 300 MG tablet Take 300 mg by mouth daily.        . cyclobenzaprine (FLEXERIL) 5 MG tablet Take 5 mg by mouth daily.      . fenofibrate 160 MG tablet Take 160 mg by mouth daily.      Marland Kitchen lisinopril (PRINIVIL,ZESTRIL) 20 MG tablet Take 20 mg by mouth daily.        No facility-administered medications prior to visit.   Allergies  Allergen Reactions  . Aspirin     REACTION: bruising  . Codeine   .  Erythromycin Ethylsuccinate     REACTION: rash  . Latex     REACTION: anaphlaxsis !!!!!!!  . Penicillins     REACTION: anaphylactic shock   Patient Active Problem List   Diagnosis Date Noted  . Diaphoresis 08/10/2013  . DYSPHAGIA 12/10/2008  . HEMORRHOIDS 12/09/2008  . ESOPHAGEAL STRICTURE 12/09/2008  . COLONIC POLYPS, ADENOMATOUS, HX OF 12/09/2008  . CHOLELITHIASIS 04/19/2008  . DIABETES MELLITUS, TYPE II 12/02/2007  . GOUT 12/02/2007  . GASTROESOPHAGEAL REFLUX DISEASE 11/25/2007  . GASTRITIS 11/25/2007  . UNSPECIFIED MYALGIA AND MYOSITIS 11/25/2007  . LUNG NODULE 11/10/2007  . OBSTRUCTIVE SLEEP APNEA 11/07/2007  . ASTHMA 11/07/2007  . DISTURBANCE  OF SKIN SENSATION 10/10/2007  . ANKLE PAIN, RIGHT 07/24/2007  . HYPERLIPIDEMIA 03/21/2007  . OBESITY NOS 03/21/2007  . DEPRESSION 03/21/2007  . HYPERTENSION 03/21/2007   History  Substance Use Topics  . Smoking status: Never Smoker   . Smokeless tobacco: Never Used  . Alcohol Use: No   family history includes Breast cancer in her mother; Coronary artery disease in her father; Diabetes in her father.     Objective:   Physical Exam   Well-developed older white female in no acute distress, pleasant blood pressure 120/76 height 5 foot 2 weight 235 BMI of 43. HEENT; nontraumatic normocephalic EOMI PERRLA sclera anicteric, Supple; no JVD, Cardiovascular; regular rate and rhythm with S1-S2 no murmur or gallop, Pulmonary; clear bilaterally, Abdomen; large soft nontender nondistended bowel sounds are active there is no palpable mass or hepatosplenomegaly, Rectal ;exam not done, Extremities; no clubbing cyanosis or edema skin warm and dry, Psych ;mood and affect appropriate       Assessment & Plan:  #8  68 year old female with chronic GERD well-controlled with twice a day pantoprazole #2 history of esophageal stricture with recurrent solid food dysphagia #3 Barrett's esophagus-last EGD January 2013 no dysplasia #4 history of adenomatous colon polyps last colonoscopy 2008-overdue for followup #5 diabetes mellitus #6 hypertension #7 sleep apnea #8 obesity  Plan; refill Protonix 40 mg by mouth twice daily/11 refills Pt was given samples of Nexium to use in the interim Schedule for EGD with  Probable Savary dilation with Dr. Fuller Plan Schedule for colonoscopy with Dr. Fuller Plan. Both procedures were discussed in detail ,and the patient is agreeable to proceed.

## 2013-09-15 NOTE — Patient Instructions (Signed)
You have been scheduled for an endoscopy and colonoscopy with propofol. Please follow the written instructions given to you at your visit today. Please pick up your prep at the pharmacy within the next 1-3 days. If you use inhalers (even only as needed), please bring them with you on the day of your procedure.  We sent the prescription for the Protonix to [facility].  We will fax the colonosopy prep to [facility] .

## 2013-11-03 ENCOUNTER — Encounter: Payer: Self-pay | Admitting: Gastroenterology

## 2013-11-03 ENCOUNTER — Ambulatory Visit (AMBULATORY_SURGERY_CENTER): Payer: Medicare Other | Admitting: Gastroenterology

## 2013-11-03 VITALS — BP 113/69 | HR 60 | Temp 97.7°F | Resp 12 | Ht 62.0 in | Wt 235.0 lb

## 2013-11-03 DIAGNOSIS — R1319 Other dysphagia: Secondary | ICD-10-CM

## 2013-11-03 DIAGNOSIS — Z86010 Personal history of colonic polyps: Secondary | ICD-10-CM

## 2013-11-03 DIAGNOSIS — D126 Benign neoplasm of colon, unspecified: Secondary | ICD-10-CM

## 2013-11-03 DIAGNOSIS — K219 Gastro-esophageal reflux disease without esophagitis: Secondary | ICD-10-CM

## 2013-11-03 MED ORDER — SODIUM CHLORIDE 0.9 % IV SOLN: 500.0000 mL | INTRAVENOUS | Status: DC

## 2013-11-03 NOTE — Op Note (Signed)
[facility]  [address] [place], [id number]   ENDOSCOPY PROCEDURE REPORT  PATIENT: [name], [name]  MR#: [id number] BIRTHDATE: 1946-02-05 , 58  yrs. old GENDER: Female ENDOSCOPIST: Ladene Artist, MD, [facility] PROCEDURE DATE:  11/03/2013 PROCEDURE:  EGD and Savary dilation of esophagus ASA CLASS:     Class I INDICATIONS:  Dysphagia.   History of esophageal reflux. MEDICATIONS: There was residual sedation effect present from prior procedure, MAC sedation, administered by CRNA, and propofol (Diprivan) 150mg  IV TOPICAL ANESTHETIC: none DESCRIPTION OF PROCEDURE: After the risks benefits and alternatives of the procedure were thoroughly explained, informed consent was obtained.  The LB [id number] endoscope was introduced through the mouth and advanced to the second portion of the duodenum. Without limitations.  The instrument was slowly withdrawn as the mucosa was fully examined.  ESOPHAGUS: A subtle stricture was found at the gastroesophageal junction.  The stenosis was traversable with the endoscope.   The esophagus was otherwise normal. STOMACH: The mucosa of the stomach appeared normal. DUODENUM: The duodenal mucosa showed no abnormalities in the bulb and second portion of the duodenum.  Retroflexed views revealed a 4 cm hiatal hernia. A guidewire was placed and the scope was then withdrawn from the patient. 16 and 17 mm Savary dilators were passed over the guidewire with minimal resistance and no heme and the procedure completed.  COMPLICATIONS: There were no complications.  ENDOSCOPIC IMPRESSION: 1.   Stricture at the gastroesophageal junction; dilated 2.    4 cm hiatal hernia  RECOMMENDATIONS: 1.  Anti-reflux regimen long term 2.  Continue PPI BID long term 3.  Post dilation instructions  eSigned:  Ladene Artist, MD, [facility] 11/03/2013 2:20 PM   CC: Dirk Dress, MD

## 2013-11-03 NOTE — Progress Notes (Signed)
Called to room to assist during endoscopic procedure.  Patient ID and intended procedure confirmed with present staff. Received instructions for my participation in the procedure from the performing physician.  

## 2013-11-03 NOTE — Progress Notes (Signed)
Report to pacu rn, vss, bbs=clear 

## 2013-11-03 NOTE — Patient Instructions (Signed)
YOU HAD AN ENDOSCOPIC PROCEDURE TODAY AT [facility]: Refer to the procedure report that was given to you for any specific questions about what was found during the examination.  If the procedure report does not answer your questions, please call your gastroenterologist to clarify.  If you requested that your care partner not be given the details of your procedure findings, then the procedure report has been included in a sealed envelope for you to review at your convenience later.  YOU SHOULD EXPECT: Some feelings of bloating in the abdomen. Passage of more gas than usual.  Walking can help get rid of the air that was put into your GI tract during the procedure and reduce the bloating. If you had a lower endoscopy (such as a colonoscopy or flexible sigmoidoscopy) you may notice spotting of blood in your stool or on the toilet paper. If you underwent a bowel prep for your procedure, then you may not have a normal bowel movement for a few days.  DIET: FOLLOW DILATION HANDOUT.  ACTIVITY: Your care partner should take you home directly after the procedure.  You should plan to take it easy, moving slowly for the rest of the day.  You can resume normal activity the day after the procedure however you should NOT DRIVE or use heavy machinery for 24 hours (because of the sedation medicines used during the test).    SYMPTOMS TO REPORT IMMEDIATELY: A gastroenterologist can be reached at any hour.  During normal business hours, 8:30 AM to 5:00 PM Monday through Friday, call [phone].  After hours and on weekends, please call the GI answering service at [phone] who will take a message and have the physician on call contact you.   Following lower endoscopy (colonoscopy or flexible sigmoidoscopy):  Excessive amounts of blood in the stool  Significant tenderness or worsening of abdominal pains  Swelling of the abdomen that is new, acute  Fever of 100F or higher  Following upper  endoscopy (EGD)  Vomiting of blood or coffee ground material  New chest pain or pain under the shoulder blades  Painful or persistently difficult swallowing  New shortness of breath  Fever of 100F or higher  Black, tarry-looking stools  FOLLOW UP: If any biopsies were taken you will be contacted by phone or by letter within the next 1-3 weeks.  Call your gastroenterologist if you have not heard about the biopsies in 3 weeks.  Our staff will call the home number listed on your records the next business day following your procedure to check on you and address any questions or concerns that you may have at that time regarding the information given to you following your procedure. This is a courtesy call and so if there is no answer at the home number and we have not heard from you through the emergency physician on call, we will assume that you have returned to your regular daily activities without incident.  SIGNATURES/CONFIDENTIALITY: You and/or your care partner have signed paperwork which will be entered into your electronic medical record.  These signatures attest to the fact that that the information above on your After Visit Summary has been reviewed and is understood.  Full responsibility of the confidentiality of this discharge information lies with you and/or your care-partner.  Hold aspirin, aspirin products,and anti-inflammatory medication for 2 weeks. Information given on polyps, hemorrhoids,high fiber diet,hiatal hernia and dilation diet with discharge instructions.

## 2013-11-03 NOTE — Op Note (Signed)
[facility]  [address] [place], [id number]   COLONOSCOPY PROCEDURE REPORT PATIENT: Helen, [name]  MR#: [id number] BIRTHDATE: 1946/06/01 , 56  yrs. old GENDER: Female ENDOSCOPIST: Ladene Artist, MD, [facility] PROCEDURE DATE:  11/03/2013 PROCEDURE:   Colonoscopy with biopsy and snare polypectomy First Screening Colonoscopy - Avg.  risk and is 50 yrs.  old or older - No.  Prior Negative Screening - Now for repeat screening. N/A  History of Adenoma - Now for follow-up colonoscopy & has been > or = to 3 yrs.  Yes hx of adenoma.  Has been 3 or more years since last colonoscopy.  Polyps Removed Today? Yes. ASA CLASS:   Class II INDICATIONS:Patient's personal history of adenomatous colon polyps.  MEDICATIONS: MAC sedation, administered by CRNA and propofol (Diprivan) 350mg  IV DESCRIPTION OF PROCEDURE:   After the risks benefits and alternatives of the procedure were thoroughly explained, informed consent was obtained.  A digital rectal exam revealed no abnormalities of the rectum.   The LB [id number]  endoscope was introduced through the anus and advanced to the cecum, which was identified by both the appendix and ileocecal valve. No adverse events experienced.   The quality of the prep was good, using MoviPrep  The instrument was then slowly withdrawn as the colon was fully examined.  COLON FINDINGS: Two sessile polyps 3 & 10 mm in size were found at the cecum.  A polypectomy was performed using snare cautery and with cold forceps.   Two sessile polyps measuring 4 mm in size were found in the ascending colon and at the hepatic flexure.  A polypectomy was performed with cold forceps.  The resection was complete and the polyp tissue was completely retrieved.   A sessile polyp measuring 6 mm in size was found in the rectum.  A polypectomy was performed with a cold snare.  The resection was complete and the polyp tissue was completely retrieved.   Mild melanosis  was found throughout the colon.  Retroflexed views revealed small internal hemorrhoids. The time to cecum=1 minutes 53 seconds.  Withdrawal time=14 minutes 30 seconds.  The scope was withdrawn and the procedure completed. COMPLICATIONS: There were no complications. ENDOSCOPIC IMPRESSION: 1.  Two sessile polyps 3 & 10 mm at the cecum; polypectomy performed using snare cautery and with cold forceps 2.  Two sessile polyps measuring 4 mm in the ascending colon and hepatic flexure; polypectomy performed with cold forceps 3.  Sessile polyp measuring 6 mm in the rectum; polypectomy performed with a cold snare 4.  Mild melanosis throughout the colon 5.  Small internal hemorrhoids RECOMMENDATIONS: 1.  Await pathology results 2.  Hold aspirin, aspirin products, and anti-inflammatory medication for 2 weeks. 3.  Repeat colonoscopy in 3 years if the largest polyp or 3 other polyps are adenomatous; otherwise 5 years eSigned:  Ladene Artist, MD, [facility] 11/03/2013 2:15 PM   cc: Dirk Dress, MD

## 2013-11-04 ENCOUNTER — Telehealth: Payer: Self-pay

## 2013-11-04 NOTE — Telephone Encounter (Signed)
Left a message on the pt's answering machine [phone] for the pt to call if any questions or concerns. [place]

## 2013-11-10 ENCOUNTER — Encounter: Payer: Self-pay | Admitting: Gastroenterology

## 2014-03-08 ENCOUNTER — Telehealth: Payer: Self-pay

## 2014-03-08 NOTE — Telephone Encounter (Signed)
LVM with pt... Informing her that if she did not have a PCP or other MD to help with monitoring her DM that we would be happy to help.

## 2014-04-12 ENCOUNTER — Encounter: Payer: Self-pay | Admitting: Gastroenterology

## 2014-07-16 ENCOUNTER — Other Ambulatory Visit: Payer: Self-pay | Admitting: Physician Assistant

## 2014-08-30 ENCOUNTER — Encounter: Payer: Self-pay | Admitting: Gastroenterology

## 2014-10-08 ENCOUNTER — Other Ambulatory Visit: Payer: Self-pay | Admitting: *Deleted

## 2014-10-08 MED ORDER — PANTOPRAZOLE SODIUM 40 MG PO TBEC: 40.0000 mg | DELAYED_RELEASE_TABLET | Freq: Two times a day (BID) | ORAL | Status: DC

## 2015-09-15 ENCOUNTER — Other Ambulatory Visit: Payer: Self-pay | Admitting: Physician Assistant

## 2016-06-12 ENCOUNTER — Other Ambulatory Visit: Payer: Self-pay | Admitting: Physician Assistant

## 2016-06-18 ENCOUNTER — Encounter: Payer: Self-pay | Admitting: Gastroenterology

## 2016-06-28 ENCOUNTER — Encounter: Payer: Self-pay | Admitting: Gastroenterology

## 2018-09-22 DIAGNOSIS — N183 Chronic kidney disease, stage 3 unspecified: Secondary | ICD-10-CM | POA: Insufficient documentation

## 2018-09-22 DIAGNOSIS — Z87892 Personal history of anaphylaxis: Secondary | ICD-10-CM

## 2018-09-22 DIAGNOSIS — E785 Hyperlipidemia, unspecified: Secondary | ICD-10-CM

## 2018-09-22 HISTORY — DX: Chronic kidney disease, stage 3 unspecified: N18.30

## 2018-09-22 HISTORY — DX: Hyperlipidemia, unspecified: E78.5

## 2018-09-22 HISTORY — DX: Personal history of anaphylaxis: Z87.892

## 2020-01-11 DIAGNOSIS — G4720 Circadian rhythm sleep disorder, unspecified type: Secondary | ICD-10-CM

## 2020-01-11 HISTORY — DX: Circadian rhythm sleep disorder, unspecified type: G47.20

## 2020-06-16 ENCOUNTER — Telehealth: Payer: Self-pay

## 2020-06-16 NOTE — Telephone Encounter (Signed)
Notes on file from "Dr. Oletta Lamas. [place]",  [phone] [id number] - Sent referral lto scheduling.

## 2021-09-11 ENCOUNTER — Encounter: Payer: Self-pay | Admitting: Cardiology

## 2021-09-11 ENCOUNTER — Encounter: Payer: Self-pay | Admitting: *Deleted

## 2021-10-17 NOTE — Progress Notes (Signed)
Cardiology Office Note:    Date:  10/18/2021   ID:  [name], DOB July 25, 1946, MRN [id number]  PCP:  Serita Grammes, MD  Cardiologist:  Shirlee More, MD   Referring MD: Serita Grammes, MD  ASSESSMENT:    1. Chronic diastolic heart failure ([place])   2. Benign hypertension with CKD (chronic kidney disease) stage III ([place])   3. Dyslipidemia    PLAN:    In order of problems listed above:  At her request I will transition to torsemide low-dose torsemide she is compensated no fluid overload continue her MRA and current antihypertensives.  She is asked me to wait a few weeks and we will check an echocardiogram at that time She will trend her blood pressure over the next weeks and a MyChart message and additional therapy as needed hydralazine Continue her statin  Next appointment 6 months    Medication Adjustments/Labs and Tests Ordered: Current medicines are reviewed at length with the patient today.  Concerns regarding medicines are outlined above.  Orders Placed This Encounter  Procedures   EKG 12-Lead   ECHOCARDIOGRAM COMPLETE   Meds ordered this encounter  Medications   furosemide (LASIX) 20 MG tablet    Sig: Take 1 tablet (20 mg total) by mouth daily.    Dispense:  90 tablet    Refill:  3     Chief Complaint  Patient presents with   Establish Care   Congestive Heart Failure    History of Present Illness:    [name] is a 76 y.o. female who is being seen today for the evaluation of heart failure at the request of Serita Grammes, MD. Reviewing nephrology notes 09/18/2021 she was on therapy with torsemide 10 mg daily and spironolactone 25 mg/day with stage III to stage IV CKD.  History of stage III CKD with gout diabetes hyperlipidemia congestive heart failure is followed by [facility] nephrology.    Chart review: Echo 09/22/2002:  SUMMARY   -  Overall left ventricular systolic function was normal. Left         ventricular ejection fraction was  estimated , range being 55         % to 65 %. There were no left ventricular regional wall         motion abnormalities.   -  There was mild mitral annular calcification. There was mild         mitral valvular regurgitation.   -  The left atrium was mildly dilated.   -  ant echo free space - consider fat pad. no evid for tamponade.  09/22/2002: FINDINGS  CLINICAL DATA:  21-YEAR-OLD FEMALE WITH CHEST PAIN, HYPERTENSION, DYSPNEA ON EXERTION.  NUCLEAR MEDICINE MYOCARDIAL PERFUSION WITH SPECT AT REST AND PERSANTINE  STRESS, LEFT VENTRICULAR  EJECTION FRACTION AND WALL MOTION - 09/22/2002  TECHNIQUE:  10 AND 30 mCi 2mc SESTAMIBI INJECTED INTRAVENOUSLY FOR REST AND PERSANTINE STRESS  IMAGES RESPECTIVELY.  THE PATIENT EXPERIENCED NO CHEST PAIN OR EKG CHANGES.  NUCLEAR MEDICINE MYOCARDIAL PERFUSION WITH SPECT AT REST AND PERSANTINE STRESS  NO ABNORMAL AREAS OF ACTIVITY ARE NOTED TO SUGGEST LEFT VENTRICULAR MYOCARDIAL ISCHEMIA OR SCAR.  LEFT VENTRICULAR EJECTION FRACTION  LEFT VENTRICULAR EJECTION FRACTION IS CALCULATED AT 727%WITH END DIASTOLIC VOLUME OF 1782ML AND END  SYSTOLIC VOLUME OF 27 ML.  LEFT VENTRICULAR WALL MOTION  NORMAL.  IMPRESSION  1.  NO EVIDENCE OF LEFT VENTRICULAR MYOCARDIAL ISCHEMIA OR SCAR.  2.  LEFT VENTRICULAR EJECTION FRACTION CALCULATED AT  78%.  I know her very well as well as her son who is his doctor nursing and works at [employer].  I also cared for her husband.  I saw her likely 20 years ago when she had diastolic heart failure was on a low-dose of loop diuretic and really had done very well.  She moved to [place] she tells me she was seeing a cardiologist there and had an echocardiogram around 2015.  She relocated to [facility] she transition from furosemide to torsemide and took an SGLT2 inhibitor and had hypotension and syncope.  Since then her blood pressures been elevated.  Kidney function is deteriorated.  She thinks she did better with furosemide and I  agree and I will transition her back and I told her I see many people of hypotension and syncope from SGLT2 inhibitors especially superimposed on diuretics and I would not put her back on.  We talked about intensifying antihypertensive therapy she wants to wait 1 week and she will send me a note about her blood pressure and if additional treatment is needed we will use hydralazine. She is not having edema orthopnea chest pain or palpitation. She has had no recurrent syncope. Past Medical History:  Diagnosis Date   Adjustment insomnia    ANKLE PAIN, RIGHT 07/24/2007   Qualifier: Diagnosis of  By: Sherren Mocha MD, Jory Ee    ASTHMA 11/07/2007   Qualifier: Diagnosis of  By: Julien Girt CMA, Leigh     Barrett's esophagus 08/2011   CHF (congestive heart failure) ([place])    CHOLELITHIASIS 04/19/2008   Qualifier: Diagnosis of  By: Sherren Mocha MD, Jory Ee    COLONIC POLYPS, ADENOMATOUS, HX OF 12/09/2008   Qualifier: Diagnosis of  By: Julaine Hua CMA Deborra Medina), Amanda     DEPRESSION 03/21/2007   Qualifier: Diagnosis of  By: Sherren Mocha RN, Dorian Pod     DIABETES MELLITUS, TYPE II 12/02/2007   Qualifier: Diagnosis of  By: Sherren Mocha MD, Jory Ee    Diaphoresis 08/10/2013   Disturbance of skin sensation 10/10/2007   Qualifier: Diagnosis of  By: Sherren Mocha MD, Jory Ee    DYSPHAGIA 12/10/2008   Qualifier: Diagnosis of  By: Fuller Plan MD Lamont Snowball T    ESOPHAGEAL STRICTURE 12/09/2008   Qualifier: Diagnosis of  By: Julaine Hua CMA (AAMA), Amanda     Fibromyalgia    GASTROESOPHAGEAL REFLUX DISEASE 11/25/2007   Qualifier: Diagnosis of  By: Roxan Hockey, Norchel     GOUT 12/02/2007   Qualifier: Diagnosis of  By: Sherren Mocha MD, Jory Ee    HEMORRHOIDS 12/09/2008   Qualifier: Diagnosis of  By: Julaine Hua CMA (AAMA), Amanda     Hiatal hernia    HYPERLIPIDEMIA 03/21/2007   Qualifier: Diagnosis of  By: Scherrie Gerlach     HYPERTENSION 03/21/2007   Qualifier: Diagnosis of  By: Scherrie Gerlach     LUNG NODULE 11/10/2007   Qualifier: Diagnosis of   By: Annamaria Boots MD, Clinton D    Myalgia and myositis    Obesity    OBSTRUCTIVE SLEEP APNEA 11/07/2007   Qualifier: Diagnosis of  By: Julien Girt CMA, Leigh     Ovarian cancer ([place]) 2009   UNSPECIFIED MYALGIA AND MYOSITIS 11/25/2007   Qualifier: Diagnosis of  By: Sherren Mocha MD, Jory Ee     Past Surgical History:  Procedure Laterality Date   CESAREAN SECTION     X2   CHOLECYSTECTOMY     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     EPINEPHrine 0.3 mg/0.3 mL IJ SOAJ injection Inject 0.3 mg into the skin once as needed for allergies.   FARXIGA 5 MG TABS tablet Take 5 mg by mouth daily.   furosemide (LASIX) 20 MG tablet Take 1 tablet (20 mg total) by mouth daily.   gabapentin (NEURONTIN) 300 MG capsule Take 300 mg by mouth 2 (two) times daily.   Magnesium 400 MG TABS Take 400 mg by mouth daily.   Multiple Vitamins-Minerals (CENTRUM WOMEN PO) Take 1 tablet by mouth every morning.   pantoprazole (PROTONIX) 40 MG tablet Take 1 tablet (40 mg total) by mouth 2 (two) times daily.   pravastatin (PRAVACHOL) 40 MG tablet Take 40 mg by mouth daily.   Semaglutide,0.25 or 0.5MG/DOS, (OZEMPIC, 0.25 OR 0.5 MG/DOSE,) 2 MG/1.5ML SOPN Inject 0.5 mg into the skin once a week.   spironolactone (ALDACTONE) 25 MG tablet Take 25 mg by mouth daily.   [DISCONTINUED] torsemide (DEMADEX) 10 MG tablet Take 10 mg by mouth daily.     Allergies:   Aspirin, Codeine, Erythromycin ethylsuccinate, Latex, and Penicillins   Social History   Socioeconomic History   Marital status: Married    Spouse name: Not on file   Number of children: 2   Years of education: Not on file   Highest education level: Not on file  Occupational History   Occupation: RN  Tobacco Use   Smoking status: Never   Smokeless  tobacco: Never  Substance and Sexual Activity   Alcohol use: No   Drug use: No   Sexual activity: Not on file  Other Topics Concern   Not on file  Social History Narrative   Not on file   Social Determinants of Health   Financial Resource Strain: Not on file  Food Insecurity: Not on file  Transportation Needs: Not on file  Physical Activity: Not on file  Stress: Not on file  Social Connections: Not on file     Family History: The patient's family history includes Breast cancer in her mother; Coronary artery disease in her father; Diabetes in her father; Heart attack in her brother and father; Multiple myeloma in her sister; Ovarian cancer in her mother.  ROS:   ROS Please see the history of present illness.     All other systems reviewed and are negative.  EKGs/Labs/Other Studies Reviewed:    The following studies were reviewed today: Sinus rhythm bradycardia 58 bpm otherwise normal EKG    Physical Exam:    VS:  BP (!) 168/79 (BP Location: Left Arm, Patient Position: Sitting, Cuff Size: Normal)    Pulse (!) 58    Ht (P) _0  (1.575 m)    Wt 220 lb (99.8 kg)    SpO2 97%    BMI (P) 40.24 kg/m     Wt Readings from Last 3 Encounters:  10/18/21 220 lb (99.8 kg)  08/24/21 218 lb (98.9 kg)  11/03/13 235 lb (106.6 kg)     GEN:  Well nourished, well developed in no acute distress HEENT: Normal NECK: No JVD; No carotid bruits LYMPHATICS: No lymphadenopathy CARDIAC: RRR, no murmurs, rubs, gallops RESPIRATORY:  Clear to auscultation without rales, wheezing or rhonchi  ABDOMEN: Soft, non-tender, non-distended MUSCULOSKELETAL:  No edema; No deformity  SKIN: Warm  and dry NEUROLOGIC:  Alert and oriented x 3 PSYCHIATRIC:  Normal affect     Signed, Shirlee More, MD  10/18/2021 3:59 PM    [place]

## 2021-10-18 ENCOUNTER — Ambulatory Visit (INDEPENDENT_AMBULATORY_CARE_PROVIDER_SITE_OTHER): Payer: Medicare Other | Admitting: Cardiology

## 2021-10-18 ENCOUNTER — Encounter: Payer: Self-pay | Admitting: Cardiology

## 2021-10-18 ENCOUNTER — Other Ambulatory Visit: Payer: Self-pay

## 2021-10-18 VITALS — BP 168/79 | HR 58 | Wt 220.0 lb

## 2021-10-18 DIAGNOSIS — I5032 Chronic diastolic (congestive) heart failure: Secondary | ICD-10-CM

## 2021-10-18 DIAGNOSIS — E785 Hyperlipidemia, unspecified: Secondary | ICD-10-CM

## 2021-10-18 DIAGNOSIS — I129 Hypertensive chronic kidney disease with stage 1 through stage 4 chronic kidney disease, or unspecified chronic kidney disease: Secondary | ICD-10-CM

## 2021-10-18 DIAGNOSIS — N183 Chronic kidney disease, stage 3 unspecified: Secondary | ICD-10-CM

## 2021-10-18 MED ORDER — FUROSEMIDE 20 MG PO TABS
20.0000 mg | ORAL_TABLET | Freq: Every day | ORAL | 3 refills | Status: DC
Start: 2021-10-18 — End: 2021-12-14

## 2021-10-18 NOTE — Patient Instructions (Addendum)
Medication Instructions:  ? ?STOP TORSEMIDE  ? ?START TAKING FUROSEMIDE 20 MG ONCE A DAY   ? ?*If you need a refill on your cardiac medications before your next appointment, please call your pharmacy* ? ? ?Lab Work: NONE ORDERED  TODAY ? ?If you have labs (blood work) drawn today and your tests are completely normal, you will receive your results only by: ?MyChart Message (if you have MyChart) OR ?A paper copy in the mail ?If you have any lab test that is abnormal or we need to change your treatment, we will call you to review the results. ? ? ?Testing/Procedures: IN 2-3 WEEKS  Your physician has requested that you have an echocardiogram. Echocardiography is a painless test that uses sound waves to create images of your heart. It provides your doctor with information about the size and shape of your heart and how well your heart?s chambers and valves are working. This procedure takes approximately one hour. There are no restrictions for this procedure. ? ? ? ?Follow-Up: ?At [facility], you and your health needs are our priority.  As part of our continuing mission to provide you with exceptional heart care, we have created designated Provider Care Teams.  These Care Teams include your primary Cardiologist (physician) and Advanced Practice Providers (APPs -  Physician Assistants and Nurse Practitioners) who all work together to provide you with the care you need, when you need it. ? ?We recommend signing up for the patient portal called "MyChart".  Sign up information is provided on this After Visit Summary.  MyChart is used to connect with patients for Virtual Visits (Telemedicine).  Patients are able to view lab/test results, encounter notes, upcoming appointments, etc.  Non-urgent messages can be sent to your provider as well.   ?To learn more about what you can do with MyChart, go to [email or web address].   ? ?Your next appointment:   ?6-8 week(s) ? ?The format for your next appointment:   ?In  Person ? ?Provider:   ?Shirlee More, MD{ ? ? ?Other Instructions ? ?

## 2021-11-01 ENCOUNTER — Telehealth: Payer: Self-pay | Admitting: *Deleted

## 2021-11-01 ENCOUNTER — Ambulatory Visit (INDEPENDENT_AMBULATORY_CARE_PROVIDER_SITE_OTHER): Payer: Medicare Other

## 2021-11-01 ENCOUNTER — Other Ambulatory Visit: Payer: Self-pay

## 2021-11-01 DIAGNOSIS — I5032 Chronic diastolic (congestive) heart failure: Secondary | ICD-10-CM | POA: Diagnosis not present

## 2021-11-01 LAB — ECHOCARDIOGRAM COMPLETE
Area-P 1/2: 3.19 cm2
S' Lateral: 2.7 cm

## 2021-11-01 NOTE — Telephone Encounter (Signed)
Informed pt of normal results for Echo. Pt verbalized understanding. ?

## 2021-11-01 NOTE — Telephone Encounter (Signed)
-----   Message from Richardo Priest, MD sent at 11/01/2021 12:40 PM EDT ----- ?Good result heart muscle function was normal I think she will do well on the present diuretic ?

## 2021-12-11 DIAGNOSIS — I509 Heart failure, unspecified: Secondary | ICD-10-CM | POA: Insufficient documentation

## 2021-12-14 ENCOUNTER — Ambulatory Visit (INDEPENDENT_AMBULATORY_CARE_PROVIDER_SITE_OTHER): Payer: Medicare Other | Admitting: Cardiology

## 2021-12-14 VITALS — HR 67 | Ht 62.0 in | Wt 221.2 lb

## 2021-12-14 DIAGNOSIS — I5032 Chronic diastolic (congestive) heart failure: Secondary | ICD-10-CM | POA: Diagnosis not present

## 2021-12-14 MED ORDER — FUROSEMIDE 20 MG PO TABS: 20.0000 mg | ORAL_TABLET | Freq: Two times a day (BID) | ORAL | 3 refills | Status: DC

## 2021-12-14 NOTE — Progress Notes (Signed)
?Cardiology Office Note:   ? ?Date:  12/14/2021  ? ?ID:  [name], DOB Apr 20, 1946, MRN [id number] ? ?PCP:  Serita Grammes, MD  ?Cardiologist:  Shirlee More, MD   ? ?Referring MD: Serita Grammes, MD  ? ? ?ASSESSMENT:   ? ?1. Chronic diastolic heart failure ([place])   ? ?PLAN:   ? ?In order of problems listed above: ? ?She is doing much better with her heart failure and hypertension related to his leave her diuretic as twice daily allow her to use her judgment if she takes only once depending on her weight and diuretic response.  Has done well with SGLT2 inhibitor ?BP is at target continue current treatment including MRA ? ? ?Next appointment: 6 months ? ? ?Medication Adjustments/Labs and Tests Ordered: ?Current medicines are reviewed at length with the patient today.  Concerns regarding medicines are outlined above.  ?No orders of the defined types were placed in this encounter. ? ?Meds ordered this encounter  ?Medications  ? furosemide (LASIX) 20 MG tablet  ?  Sig: Take 1 tablet (20 mg total) by mouth 2 (two) times daily.  ?  Dispense:  180 tablet  ?  Refill:  3  ? ? ?Chief Complaint  ?Patient presents with  ? Follow-up  ? ? ?History of Present Illness:   ? ?[name] is a 76 y.o. female with a hx of hypertensive heart disease with stage III CKD dyslipidemia and heart failure last seen 10/18/2021.  She was transition to torsemide and underwent an echocardiogram performed 11/01/2021. ? ?Compliance with diet, lifestyle and medications: Yes ? ?She is back in balance weight is stable ?And she is having no edema shortness of breath; diuretic no chest pain palpitations syncope ? ? 1. GLS -12.4. Left ventricular ejection fraction, by estimation, is 60 to  ?65%. The left ventricle has normal function. The left ventricle has no  ?regional wall motion abnormalities. There is mild left ventricular  ?hypertrophy. Left ventricular diastolic  ?parameters are consistent with Grade I diastolic dysfunction (impaired   ?relaxation).  ? 2. Right ventricular systolic function is normal. The right ventricular  ?size is normal. There is normal pulmonary artery systolic pressure.  ? 3. The mitral valve is normal in structure. No evidence of mitral valve  ?regurgitation. No evidence of mitral stenosis.  ? 4. The aortic valve is normal in structure. Aortic valve regurgitation is  ?not visualized. No aortic stenosis is present.  ? 5. The inferior vena cava is normal in size with greater than 50%  ?respiratory variability, suggesting right atrial pressure of 3 mmHg.  ?Past Medical History:  ?Diagnosis Date  ? Adjustment insomnia   ? ANKLE PAIN, RIGHT 07/24/2007  ? Qualifier: Diagnosis of  By: Sherren Mocha MD, Jory Ee   ? ASTHMA 11/07/2007  ? Qualifier: Diagnosis of  By: Julien Girt CMA, Leigh    ? Barrett's esophagus 08/2011  ? CHF (congestive heart failure) ([place])   ? CHOLELITHIASIS 04/19/2008  ? Qualifier: Diagnosis of  By: Sherren Mocha MD, Jory Ee   ? COLONIC POLYPS, ADENOMATOUS, HX OF 12/09/2008  ? Qualifier: Diagnosis of  By: Julaine Hua CMA Deborra Medina), Estill Bamberg    ? DEPRESSION 03/21/2007  ? Qualifier: Diagnosis of  By: Scherrie Gerlach    ? DIABETES MELLITUS, TYPE II 12/02/2007  ? Qualifier: Diagnosis of  By: Sherren Mocha MD, Jory Ee   ? Diaphoresis 08/10/2013  ? Disturbance of skin sensation 10/10/2007  ? Qualifier: Diagnosis of  By: Sherren Mocha MD, Jory Ee   ? DYSPHAGIA  12/10/2008  ? Qualifier: Diagnosis of  By: Fuller Plan MD Lamont Snowball T   ? ESOPHAGEAL STRICTURE 12/09/2008  ? Qualifier: Diagnosis of  By: Julaine Hua CMA Deborra Medina), Estill Bamberg    ? Fibromyalgia   ? GASTROESOPHAGEAL REFLUX DISEASE 11/25/2007  ? Qualifier: Diagnosis of  By: Roxan Hockey, Norchel    ? GOUT 12/02/2007  ? Qualifier: Diagnosis of  By: Sherren Mocha MD, Jory Ee   ? HEMORRHOIDS 12/09/2008  ? Qualifier: Diagnosis of  By: Julaine Hua CMA Deborra Medina), Estill Bamberg    ? Hiatal hernia   ? HYPERLIPIDEMIA 03/21/2007  ? Qualifier: Diagnosis of  By: Scherrie Gerlach    ? HYPERTENSION 03/21/2007  ? Qualifier: Diagnosis of  By:  Scherrie Gerlach    ? LUNG NODULE 11/10/2007  ? Qualifier: Diagnosis of  By: Annamaria Boots MD, Tarri Fuller D   ? Myalgia and myositis   ? Obesity   ? OBSTRUCTIVE SLEEP APNEA 11/07/2007  ? Qualifier: Diagnosis of  By: Julien Girt CMA, Leigh    ? Ovarian cancer [facility]) 2009  ? UNSPECIFIED MYALGIA AND MYOSITIS 11/25/2007  ? Qualifier: Diagnosis of  By: Sherren Mocha MD, Jory Ee   ? ? ?Past Surgical History:  ?Procedure Laterality Date  ? CESAREAN SECTION    ? X2  ? CHOLECYSTECTOMY    ? REPLACEMENT TOTAL KNEE  07/2005, 10/2010  ? bilateral  ? TONSILLECTOMY    ? TRACHEOSTOMY    ? VAGINAL HYSTERECTOMY  08/21/2007  ? for ovarian tumor  ? ? ?Current Medications: ?Current Meds  ?Medication Sig  ? allopurinol (ZYLOPRIM) 300 MG tablet Take 300 mg by mouth daily.    ? EPINEPHrine 0.3 mg/0.3 mL IJ SOAJ injection Inject 0.3 mg into the skin once as needed for allergies.  ? FARXIGA 5 MG TABS tablet Take 5 mg by mouth daily.  ? furosemide (LASIX) 20 MG tablet Take 1 tablet (20 mg total) by mouth 2 (two) times daily.  ? gabapentin (NEURONTIN) 300 MG capsule Take 300 mg by mouth 2 (two) times daily.  ? Magnesium 400 MG TABS Take 400 mg by mouth daily.  ? Multiple Vitamins-Minerals (CENTRUM WOMEN PO) Take 1 tablet by mouth every morning.  ? pantoprazole (PROTONIX) 40 MG tablet Take 1 tablet (40 mg total) by mouth 2 (two) times daily.  ? pravastatin (PRAVACHOL) 40 MG tablet Take 40 mg by mouth daily.  ? Semaglutide,0.25 or 0.5MG/DOS, (OZEMPIC, 0.25 OR 0.5 MG/DOSE,) 2 MG/1.5ML SOPN Inject 0.5 mg into the skin once a week.  ? spironolactone (ALDACTONE) 25 MG tablet Take 25 mg by mouth daily.  ? [DISCONTINUED] furosemide (LASIX) 20 MG tablet Take 1 tablet (20 mg total) by mouth daily.  ?  ? ?Allergies:   Aspirin, Codeine, Dapagliflozin, Erythromycin ethylsuccinate, Latex, and Penicillins  ? ?Social History  ? ?Socioeconomic History  ? Marital status: Married  ?  Spouse name: Not on file  ? Number of children: 2  ? Years of education: Not on file  ? Highest education  level: Not on file  ?Occupational History  ? Occupation: Therapist, sports  ?Tobacco Use  ? Smoking status: Never  ? Smokeless tobacco: Never  ?Substance and Sexual Activity  ? Alcohol use: No  ? Drug use: No  ? Sexual activity: Not on file  ?Other Topics Concern  ? Not on file  ?Social History Narrative  ? Not on file  ? ?Social Determinants of Health  ? ?Financial Resource Strain: Not on file  ?Food Insecurity: Not on file  ?Transportation Needs: Not on file  ?  Physical Activity: Not on file  ?Stress: Not on file  ?Social Connections: Not on file  ?  ? ?Family History: ?The patient's family history includes Breast cancer in her mother; Coronary artery disease in her father; Diabetes in her father; Heart attack in her brother and father; Multiple myeloma in her sister; Ovarian cancer in her mother. ?ROS:   ?Please see the history of present illness.    ?All other systems reviewed and are negative. ? ?EKGs/Labs/Other Studies Reviewed:   ? ?The following studies were reviewed today: ? ? ? ?Recent Labs: ?No results found for requested labs within last 8760 hours.  ?Recent Lipid Panel ?   ?Component Value Date/Time  ? CHOL (H) 12/24/2007 0920  ?  223        ?ATP III CLASSIFICATION: ? <200     mg/dL   Desirable ? 200-239  mg/dL   Borderline High ? >=240    mg/dL   High  ? TRIG 103 12/24/2007 0920  ? HDL 30 (L) 12/24/2007 0920  ? CHOLHDL 7.4 12/24/2007 0920  ? VLDL 21 12/24/2007 0920  ? [place] (H) 12/24/2007 0920  ?  172        ?Total Cholesterol/HDL:CHD Risk ?Coronary Heart Disease Risk Table ?                    Men   Women ? 1/2 Average Risk   3.4   3.3  ? LDLDIRECT 165.5 06/24/2007 1545  ? ? ?Physical Exam:   ? ?VS:  Pulse 67   Ht '5\' 2"'  (1.575 m)   Wt 221 lb 3.2 oz (100.3 kg)   SpO2 95%   BMI 40.46 kg/m?    ? ?Wt Readings from Last 3 Encounters:  ?12/14/21 221 lb 3.2 oz (100.3 kg)  ?10/18/21 220 lb (99.8 kg)  ?08/24/21 218 lb (98.9 kg)  ?  ? ?GEN:  Well nourished, well developed in no acute distress ?HEENT: Normal ?NECK: No  JVD; No carotid bruits ?LYMPHATICS: No lymphadenopathy ?CARDIAC: RRR, no murmurs, rubs, gallops ?RESPIRATORY:  Clear to auscultation without rales, wheezing or rhonchi  ?ABDOMEN: Soft, non-tender, non-distended ?MU

## 2021-12-14 NOTE — Patient Instructions (Addendum)
Medication Instructions:  ?Your physician has recommended you make the following change in your medication:  ? ?START: Lasix 20 mg twice daily ? ?*If you need a refill on your cardiac medications before your next appointment, please call your pharmacy* ? ? ?Lab Work: ?None ?If you have labs (blood work) drawn today and your tests are completely normal, you will receive your results only by: ?MyChart Message (if you have MyChart) OR ?A paper copy in the mail ?If you have any lab test that is abnormal or we need to change your treatment, we will call you to review the results. ? ? ?Testing/Procedures: ?None ? ? ?Follow-Up: ?At [facility], you and your health needs are our priority.  As part of our continuing mission to provide you with exceptional heart care, we have created designated Provider Care Teams.  These Care Teams include your primary Cardiologist (physician) and Advanced Practice Providers (APPs -  Physician Assistants and Nurse Practitioners) who all work together to provide you with the care you need, when you need it. ? ?We recommend signing up for the patient portal called "MyChart".  Sign up information is provided on this After Visit Summary.  MyChart is used to connect with patients for Virtual Visits (Telemedicine).  Patients are able to view lab/test results, encounter notes, upcoming appointments, etc.  Non-urgent messages can be sent to your provider as well.   ?To learn more about what you can do with MyChart, go to [email or web address].   ? ?Your next appointment:   ?9 month(s) ? ?The format for your next appointment:   ?In Person ? ?Provider:   ?Shirlee More, MD  ? ? ?Other Instructions ?None ? ?Important Information About Sugar ? ? ? ? ? ? ?

## 2022-01-23 ENCOUNTER — Telehealth: Payer: Self-pay

## 2022-01-23 NOTE — Telephone Encounter (Signed)
   Pre-operative Risk Assessment    Patient Name: [name]  DOB: 07-09-1946 MRN: [id number]      Request for Surgical Clearance    Procedure:   Left Total Hip Arthroplasty  Date of Surgery:  Clearance TBD                                 Surgeon:  Dr. Creig Hines, MD Surgeon's Group or Practice Name:  [facility] Phone number:  [phone] Fax number:  [phone]   Type of Clearance Requested:   - Medical    Type of Anesthesia:  General    Additional requests/questions:    SignedToni Arthurs   01/23/2022, 12:15 PM

## 2022-01-23 NOTE — Telephone Encounter (Signed)
    Patient Name: [name]  DOB: 1945-12-15 MRN: [id number]  Primary Cardiologist: None  Chart reviewed as part of pre-operative protocol coverage. The patient was doing well on cardiac stand point when last seen by Dr. Bettina Gavia 12/14/21. Given past medical history and time since last visit, based on ACC/AHA guidelines, Edra Riccardi would be at acceptable risk for the planned procedure without further cardiovascular testing.   I will route this recommendation to the requesting party via Epic fax function and remove from pre-op pool.  Please call with questions.  [place], [place] 01/23/2022, 2:33 PM

## 2022-03-01 DIAGNOSIS — Z01818 Encounter for other preprocedural examination: Secondary | ICD-10-CM

## 2022-09-15 NOTE — Progress Notes (Unsigned)
Cardiology Office Note:    Date:  09/17/2022   ID:  [name], DOB 11-Jul-1946, MRN [id number]  PCP:  Serita Grammes, MD  Cardiologist:  Shirlee More, MD    Referring MD: Serita Grammes, MD    ASSESSMENT:    1. Hypertensive heart disease with heart failure (HCC)   2. Stage 3 chronic kidney disease, unspecified whether stage 3a or 3b CKD ([place])   3. Dyslipidemia    PLAN:    In order of problems listed above:  Home blood pressures are at target we will continue to trend and follow heart failure looks compensated we will check a proBNP level with increased shortness of breath I will also have her take an extra dose of diuretic every other day Continue her statin    Next appointment: 6 months   Medication Adjustments/Labs and Tests Ordered: Current medicines are reviewed at length with the patient today.  Concerns regarding medicines are outlined above.  No orders of the defined types were placed in this encounter.  No orders of the defined types were placed in this encounter.   Chief Complaint  Patient presents with   Follow-up   Hypertension   Congestive Heart Failure    History of Present Illness:    [name] is a 77 y.o. female with a hx of hypertensive heart disease with stage III CKD and heart failure and dyslipidemia last seen 12/14/2021 with compensated persistent heart failure.  Compliance with diet, lifestyle and medications: Yes  She has been in physical therapy less active and wonders if she requires a higher dose of diuretic with more exertional shortness of breath and fatigue She is not having edema orthopnea chest pain palpitation or syncope With a validated device and good technique her home blood pressures run consistently 120-130/70. Past Medical History:  Diagnosis Date   Adjustment insomnia    ANKLE PAIN, RIGHT 07/24/2007   Qualifier: Diagnosis of  By: Sherren Mocha MD, Jory Ee    ASTHMA 11/07/2007   Qualifier: Diagnosis of  By: Julien Girt  CMA, Leigh     Barrett's esophagus 08/2011   CHF (congestive heart failure) ([place])    CHOLELITHIASIS 04/19/2008   Qualifier: Diagnosis of  By: Sherren Mocha MD, Jory Ee    COLONIC POLYPS, ADENOMATOUS, HX OF 12/09/2008   Qualifier: Diagnosis of  By: Julaine Hua CMA Deborra Medina), Amanda     DEPRESSION 03/21/2007   Qualifier: Diagnosis of  By: Sherren Mocha RN, Dorian Pod     DIABETES MELLITUS, TYPE II 12/02/2007   Qualifier: Diagnosis of  By: Sherren Mocha MD, Jory Ee    Diaphoresis 08/10/2013   Disturbance of skin sensation 10/10/2007   Qualifier: Diagnosis of  By: Sherren Mocha MD, Jory Ee    DYSPHAGIA 12/10/2008   Qualifier: Diagnosis of  By: Fuller Plan MD Lamont Snowball T    ESOPHAGEAL STRICTURE 12/09/2008   Qualifier: Diagnosis of  By: Julaine Hua CMA (AAMA), Amanda     Fibromyalgia    GASTROESOPHAGEAL REFLUX DISEASE 11/25/2007   Qualifier: Diagnosis of  By: Roxan Hockey, [place]     GOUT 12/02/2007   Qualifier: Diagnosis of  By: Sherren Mocha MD, Jory Ee    HEMORRHOIDS 12/09/2008   Qualifier: Diagnosis of  By: Julaine Hua CMA (AAMA), Amanda     Hiatal hernia    HYPERLIPIDEMIA 03/21/2007   Qualifier: Diagnosis of  By: Scherrie Gerlach     HYPERTENSION 03/21/2007   Qualifier: Diagnosis of  By: Scherrie Gerlach     LUNG NODULE 11/10/2007   Qualifier: Diagnosis of  ByAnnamaria Boots MD, [place] D    Myalgia and myositis    Obesity    OBSTRUCTIVE SLEEP APNEA 11/07/2007   Qualifier: Diagnosis of  By: Julien Girt CMA, Leigh     Ovarian cancer [facility]) 2009   UNSPECIFIED MYALGIA AND MYOSITIS 11/25/2007   Qualifier: Diagnosis of  By: Sherren Mocha MD, Jory Ee     Past Surgical History:  Procedure Laterality Date   CESAREAN SECTION     X2   CHOLECYSTECTOMY     REPLACEMENT TOTAL KNEE  07/2005, 10/2010   bilateral   TONSILLECTOMY     TRACHEOSTOMY     VAGINAL HYSTERECTOMY  08/21/2007   for ovarian tumor    Current Medications: Current Meds  Medication Sig   allopurinol (ZYLOPRIM) 300 MG tablet Take 300 mg by mouth daily.     celecoxib (CELEBREX) 200  MG capsule Take 200 mg by mouth 2 (two) times daily.   EPINEPHrine 0.3 mg/0.3 mL IJ SOAJ injection Inject 0.3 mg into the skin once as needed for allergies.   furosemide (LASIX) 20 MG tablet Take 1 tablet (20 mg total) by mouth 2 (two) times daily.   gabapentin (NEURONTIN) 300 MG capsule Take 300 mg by mouth 2 (two) times daily.   Magnesium 400 MG TABS Take 400 mg by mouth daily.   Multiple Vitamins-Minerals (CENTRUM WOMEN PO) Take 1 tablet by mouth every morning.   pantoprazole (PROTONIX) 40 MG tablet Take 1 tablet (40 mg total) by mouth 2 (two) times daily.   pravastatin (PRAVACHOL) 40 MG tablet Take 40 mg by mouth daily.   Semaglutide,0.25 or 0.'5MG'$ /DOS, (OZEMPIC, 0.25 OR 0.5 MG/DOSE,) 2 MG/1.5ML SOPN Inject 0.5 mg into the skin once a week.   spironolactone (ALDACTONE) 25 MG tablet Take 25 mg by mouth daily.     Allergies:   Aspirin, Codeine, Dapagliflozin, Erythromycin ethylsuccinate, Latex, and Penicillins   Social History   Socioeconomic History   Marital status: Married    Spouse name: Not on file   Number of children: 2   Years of education: Not on file   Highest education level: Not on file  Occupational History   Occupation: RN  Tobacco Use   Smoking status: Never   Smokeless tobacco: Never  Substance and Sexual Activity   Alcohol use: No   Drug use: No   Sexual activity: Not on file  Other Topics Concern   Not on file  Social History Narrative   Not on file   Social Determinants of Health   Financial Resource Strain: Not on file  Food Insecurity: Not on file  Transportation Needs: Not on file  Physical Activity: Not on file  Stress: Not on file  Social Connections: Not on file     Family History: The patient's family history includes Breast cancer in her mother; Coronary artery disease in her father; Diabetes in her father; Heart attack in her brother and father; Multiple myeloma in her sister; Ovarian cancer in her mother. ROS:   Please see the history of  present illness.    All other systems reviewed and are negative.  EKGs/Labs/Other Studies Reviewed:    The following studies were reviewed today:  Her echocardiogram this year shows:  1. GLS -12.4. Left ventricular ejection fraction, by estimation, is 60 to  65%. The left ventricle has normal function. The left ventricle has no  regional wall motion abnormalities. There is mild left ventricular  hypertrophy. Left ventricular diastolic  parameters are consistent with Grade I diastolic dysfunction (impaired  relaxation).   2. Right ventricular systolic function is normal. The right ventricular  size is normal. There is normal pulmonary artery systolic pressure.   3. The mitral valve is normal in structure. No evidence of mitral valve  regurgitation. No evidence of mitral stenosis.   4. The aortic valve is normal in structure. Aortic valve regurgitation is  not visualized. No aortic stenosis is present.   5. The inferior vena cava is normal in size with greater than 50%  respiratory variability, suggesting right atrial pressure of 3 mmHg.  Recent Labs: No results found for requested labs within last 365 days.  Recent Lipid Panel    Component Value Date/Time   CHOL (H) 12/24/2007 0920    223        ATP III CLASSIFICATION:  <200     mg/dL   Desirable  200-239  mg/dL   Borderline High  >=240    mg/dL   High   TRIG 103 12/24/2007 0920   HDL 30 (L) 12/24/2007 0920   CHOLHDL 7.4 12/24/2007 0920   VLDL 21 12/24/2007 0920   LDLCALC (H) 12/24/2007 0920    172        Total Cholesterol/HDL:CHD Risk Coronary Heart Disease Risk Table                     Men   Women  1/2 Average Risk   3.4   3.3   LDLDIRECT 165.5 06/24/2007 1545    Physical Exam:    VS:  BP (!) 162/68 (BP Location: Right Wrist, Patient Position: Sitting)   Pulse (!) 56   Ht '5\' 2"'$  (1.575 m)   Wt 218 lb 9.6 oz (99.2 kg)   SpO2 97%   BMI 39.98 kg/m     Wt Readings from Last 3 Encounters:  09/17/22 218 lb 9.6 oz  (99.2 kg)  12/14/21 221 lb 3.2 oz (100.3 kg)  10/18/21 220 lb (99.8 kg)     GEN:  Well nourished, well developed in no acute distress HEENT: Normal NECK: No JVD; No carotid bruits LYMPHATICS: No lymphadenopathy CARDIAC: RRR, no murmurs, rubs, gallops RESPIRATORY:  Clear to auscultation without rales, wheezing or rhonchi  ABDOMEN: Soft, non-tender, non-distended MUSCULOSKELETAL:  No edema; No deformity  SKIN: Warm and dry NEUROLOGIC:  Alert and oriented x 3 PSYCHIATRIC:  Normal affect    Signed, Shirlee More, MD  09/17/2022 11:04 AM    [place]

## 2022-09-17 ENCOUNTER — Other Ambulatory Visit: Payer: Self-pay

## 2022-09-17 ENCOUNTER — Encounter: Payer: Self-pay | Admitting: Cardiology

## 2022-09-17 ENCOUNTER — Ambulatory Visit: Payer: Medicare Other | Attending: Cardiology | Admitting: Cardiology

## 2022-09-17 VITALS — BP 162/68 | HR 56 | Ht 62.0 in | Wt 218.6 lb

## 2022-09-17 DIAGNOSIS — E785 Hyperlipidemia, unspecified: Secondary | ICD-10-CM

## 2022-09-17 DIAGNOSIS — N183 Chronic kidney disease, stage 3 unspecified: Secondary | ICD-10-CM | POA: Diagnosis not present

## 2022-09-17 DIAGNOSIS — I110 Hypertensive heart disease with heart failure: Secondary | ICD-10-CM

## 2022-09-17 MED ORDER — FUROSEMIDE 20 MG PO TABS: 20.0000 mg | ORAL_TABLET | Freq: Two times a day (BID) | ORAL | 3 refills | Status: DC

## 2022-09-17 NOTE — Patient Instructions (Signed)
Medication Instructions:  Your physician has recommended you make the following change in your medication:   START: Lasix 20 mg twice daily (take an extra lasix every other day)  *If you need a refill on your cardiac medications before your next appointment, please call your pharmacy*   Lab Work: Your physician recommends that you return for lab work in:   Labs today: CMP, Lipids,Pro BNP  If you have labs (blood work) drawn today and your tests are completely normal, you will receive your results only by: MyChart Message (if you have MyChart) OR A paper copy in the mail If you have any lab test that is abnormal or we need to change your treatment, we will call you to review the results.   Testing/Procedures: None   Follow-Up: At [facility], you and your health needs are our priority.  As part of our continuing mission to provide you with exceptional heart care, we have created designated Provider Care Teams.  These Care Teams include your primary Cardiologist (physician) and Advanced Practice Providers (APPs -  Physician Assistants and Nurse Practitioners) who all work together to provide you with the care you need, when you need it.  We recommend signing up for the patient portal called "MyChart".  Sign up information is provided on this After Visit Summary.  MyChart is used to connect with patients for Virtual Visits (Telemedicine).  Patients are able to view lab/test results, encounter notes, upcoming appointments, etc.  Non-urgent messages can be sent to your provider as well.   To learn more about what you can do with MyChart, go to [email or web address].    Your next appointment:   6 month(s)  Provider:   Shirlee More, MD    Other Instructions None

## 2022-09-18 LAB — COMPREHENSIVE METABOLIC PANEL
ALT: 14 IU/L (ref 0–32)
AST: 16 IU/L (ref 0–40)
Albumin/Globulin Ratio: 2.2 (ref 1.2–2.2)
Albumin: 4.4 g/dL (ref 3.8–4.8)
Alkaline Phosphatase: 92 IU/L (ref 44–121)
BUN/Creatinine Ratio: 21 (ref 12–28)
BUN: 34 mg/dL — ABNORMAL HIGH (ref 8–27)
Bilirubin Total: 0.7 mg/dL (ref 0.0–1.2)
CO2: 25 mmol/L (ref 20–29)
Calcium: 9.7 mg/dL (ref 8.7–10.3)
Chloride: 103 mmol/L (ref 96–106)
Creatinine, Ser: 1.59 mg/dL — ABNORMAL HIGH (ref 0.57–1.00)
Globulin, Total: 2 g/dL (ref 1.5–4.5)
Glucose: 145 mg/dL — ABNORMAL HIGH (ref 70–99)
Potassium: 4.7 mmol/L (ref 3.5–5.2)
Sodium: 144 mmol/L (ref 134–144)
Total Protein: 6.4 g/dL (ref 6.0–8.5)
eGFR: 34 mL/min/{1.73_m2} — ABNORMAL LOW (ref >59)

## 2022-09-18 LAB — LIPID PANEL
Chol/HDL Ratio: 4.8 ratio — ABNORMAL HIGH (ref 0.0–4.4)
Cholesterol, Total: 176 mg/dL (ref 100–199)
HDL: 37 mg/dL — ABNORMAL LOW (ref >39)
LDL Chol Calc (NIH): 107 mg/dL — ABNORMAL HIGH (ref 0–99)
Triglycerides: 180 mg/dL — ABNORMAL HIGH (ref 0–149)
VLDL Cholesterol Cal: 32 mg/dL (ref 5–40)

## 2022-09-18 LAB — PRO B NATRIURETIC PEPTIDE: NT-Pro BNP: 98 pg/mL (ref 0–301)

## 2023-02-03 ENCOUNTER — Other Ambulatory Visit: Payer: Self-pay | Admitting: Cardiology

## 2023-03-19 NOTE — Progress Notes (Unsigned)
Cardiology Office Note:    Date:  03/20/2023   ID:  Terrionna Bison, DOB Dec 05, 1945, MRN [id number]  PCP:  Buckner Malta, MD  Cardiologist:  Norman Herrlich, MD    Referring MD: Buckner Malta, MD    ASSESSMENT:    1. Hypertensive heart disease with heart failure (HCC)   2. Stage 3 chronic kidney disease, unspecified whether stage 3a or 3b CKD (HCC)   3. Dyslipidemia    PLAN:    In order of problems listed above:  She is doing better with semaglutide very beneficial and heart failure diuretic requirements have decreased has no evidence of fluid low overload and currently New York Heart Association class I she will continue her combined low-dose furosemide and spironolactone with diastolic heart failure BP is at target. Stable CKD Currently not on lipid-lowering therapy   Next appointment: 6 months   Medication Adjustments/Labs and Tests Ordered: Current medicines are reviewed at length with the patient today.  Concerns regarding medicines are outlined above.  Orders Placed This Encounter  Procedures   EKG 12-Lead   No orders of the defined types were placed in this encounter.    History of Present Illness:    [name] is a 77 y.o. female with a hx of hypertensive heart disease with heart failure and stage III CKD and dyslipidemia last seen 09/17/2022.  Compliance with diet, lifestyle and medications: Yes  They are very pleased with her medical interaction with [name] nurse practitioner She has been on semaglutide has lost about 20 pounds but is markedly improved the quality of her life and her diuretic requirement has decreased Strength and endurance are good and she is not having edema shortness of breath chest pain palpitation or syncope EKG today reassuring Recent labs 10/09/2022 cholesterol 153 LDL 107 HDL 36 triglycerides 211 A1c 6.5 hemoglobin 13.1 currently 1.6 potassium 4.702 22,024 Past Medical History:  Diagnosis Date   Adjustment  insomnia    ANKLE PAIN, RIGHT 07/24/2007   Qualifier: Diagnosis of  By: Tawanna Cooler MD, Eugenio Hoes    ASTHMA 11/07/2007   Qualifier: Diagnosis of  By: Renaldo Fiddler CMA, Leigh     Barrett's esophagus 08/2011   CHF (congestive heart failure) (HCC)    CHOLELITHIASIS 04/19/2008   Qualifier: Diagnosis of  By: Tawanna Cooler MD, Eugenio Hoes    COLONIC POLYPS, ADENOMATOUS, HX OF 12/09/2008   Qualifier: Diagnosis of  By: Misty Stanley CMA Duncan Dull), Amanda     DEPRESSION 03/21/2007   Qualifier: Diagnosis of  By: Tawanna Cooler RN, Alvino Chapel     DIABETES MELLITUS, TYPE II 12/02/2007   Qualifier: Diagnosis of  By: Tawanna Cooler MD, Eugenio Hoes    Diaphoresis 08/10/2013   Disturbance of skin sensation 10/10/2007   Qualifier: Diagnosis of  By: Tawanna Cooler MD, Eugenio Hoes    DYSPHAGIA 12/10/2008   Qualifier: Diagnosis of  By: Russella Dar MD Bronson Curb T    ESOPHAGEAL STRICTURE 12/09/2008   Qualifier: Diagnosis of  By: Misty Stanley CMA (AAMA), Amanda     Fibromyalgia    GASTROESOPHAGEAL REFLUX DISEASE 11/25/2007   Qualifier: Diagnosis of  By: Clydia Llano, Norchel     GOUT 12/02/2007   Qualifier: Diagnosis of  By: Tawanna Cooler MD, Eugenio Hoes    HEMORRHOIDS 12/09/2008   Qualifier: Diagnosis of  By: Misty Stanley CMA (AAMA), Amanda     Hiatal hernia    HYPERLIPIDEMIA 03/21/2007   Qualifier: Diagnosis of  By: Everett Graff     HYPERTENSION 03/21/2007   Qualifier: Diagnosis of  By: Tawanna Cooler, RN,  Alvino Chapel     LUNG NODULE 11/10/2007   Qualifier: Diagnosis of  By: Maple Hudson MD, Clinton D    Myalgia and myositis    Obesity    OBSTRUCTIVE SLEEP APNEA 11/07/2007   Qualifier: Diagnosis of  By: Renaldo Fiddler CMA, Leigh     Ovarian cancer [facility]) 2009   UNSPECIFIED MYALGIA AND MYOSITIS 11/25/2007   Qualifier: Diagnosis of  By: Tawanna Cooler MD, Eugenio Hoes     Current Medications: Current Meds  Medication Sig   allopurinol (ZYLOPRIM) 300 MG tablet Take 300 mg by mouth daily.     EPINEPHrine 0.3 mg/0.3 mL IJ SOAJ injection Inject 0.3 mg into the skin once as needed for allergies.   furosemide (LASIX) 20  MG tablet Take 1 tablet (20 mg total) by mouth 2 (two) times daily. Please take an extra furosemide every other day.   Magnesium 400 MG TABS Take 400 mg by mouth daily.   Multiple Vitamins-Minerals (CENTRUM WOMEN PO) Take 1 tablet by mouth every morning.   pantoprazole (PROTONIX) 40 MG tablet Take 1 tablet (40 mg total) by mouth 2 (two) times daily.   Semaglutide,0.25 or 0.5MG /DOS, (OZEMPIC, 0.25 OR 0.5 MG/DOSE,) 2 MG/1.5ML SOPN Inject 0.5 mg into the skin once a week.   spironolactone (ALDACTONE) 25 MG tablet Take 25 mg by mouth daily.      EKGs/Labs/Other Studies Reviewed:    The following studies were reviewed today:  Cardiac Studies & Procedures       ECHOCARDIOGRAM  ECHOCARDIOGRAM COMPLETE 11/01/2021  Narrative ECHOCARDIOGRAM REPORT    Patient Name:   [name] Date of Exam: 11/01/2021 Medical Rec #:  [id number]      Height:       62.0 in Accession #:    [id number]     Weight:       220.0 lb Date of Birth:  [birth year]      BSA:          1.991 m Patient Age:    77 years       BP:           168/79 mmHg Patient Gender: F              HR:           61 bpm. Exam Location:  [place]  Procedure: 2D Echo, Cardiac Doppler, Color Doppler and Strain Analysis  Indications:    CHF-Acute Diastolic I50.31  History:        Patient has no prior history of Echocardiogram examinations. Risk Factors:Hypertension, Diabetes and Dyslipidemia.  Sonographer:    Louie Boston RDCS Referring Phys: [id number] Meda Dudzinski J Shelby Anderle  IMPRESSIONS   1. GLS -12.4. Left ventricular ejection fraction, by estimation, is 60 to 65%. The left ventricle has normal function. The left ventricle has no regional wall motion abnormalities. There is mild left ventricular hypertrophy. Left ventricular diastolic parameters are consistent with Grade I diastolic dysfunction (impaired relaxation). 2. Right ventricular systolic function is normal. The right ventricular size is normal. There is normal pulmonary artery systolic  pressure. 3. The mitral valve is normal in structure. No evidence of mitral valve regurgitation. No evidence of mitral stenosis. 4. The aortic valve is normal in structure. Aortic valve regurgitation is not visualized. No aortic stenosis is present. 5. The inferior vena cava is normal in size with greater than 50% respiratory variability, suggesting right atrial pressure of 3 mmHg.  FINDINGS Left Ventricle: GLS -12.4. Left ventricular ejection fraction, by estimation, is 60 to 65%. The left  ventricle has normal function. The left ventricle has no regional wall motion abnormalities. The left ventricular internal cavity size was normal in size. There is mild left ventricular hypertrophy. Left ventricular diastolic parameters are consistent with Grade I diastolic dysfunction (impaired relaxation).  Right Ventricle: The right ventricular size is normal. No increase in right ventricular wall thickness. Right ventricular systolic function is normal. There is normal pulmonary artery systolic pressure. The tricuspid regurgitant velocity is 2.11 m/s, and with an assumed right atrial pressure of 3 mmHg, the estimated right ventricular systolic pressure is 20.8 mmHg.  Left Atrium: Left atrial size was normal in size.  Right Atrium: Right atrial size was normal in size.  Pericardium: There is no evidence of pericardial effusion.  Mitral Valve: The mitral valve is normal in structure. No evidence of mitral valve regurgitation. No evidence of mitral valve stenosis.  Tricuspid Valve: The tricuspid valve is normal in structure. Tricuspid valve regurgitation is mild . No evidence of tricuspid stenosis.  Aortic Valve: The aortic valve is normal in structure. Aortic valve regurgitation is not visualized. No aortic stenosis is present.  Pulmonic Valve: The pulmonic valve was normal in structure. Pulmonic valve regurgitation is not visualized. No evidence of pulmonic stenosis.  Aorta: The aortic root is normal  in size and structure.  Venous: The inferior vena cava is normal in size with greater than 50% respiratory variability, suggesting right atrial pressure of 3 mmHg.  IAS/Shunts: No atrial level shunt detected by color flow Doppler.   LEFT VENTRICLE PLAX 2D LVIDd:         4.70 cm   Diastology LVIDs:         2.70 cm   LV e' medial:    5.22 cm/s LV PW:         1.20 cm   LV E/e' medial:  11.6 LV IVS:        1.20 cm   LV e' lateral:   5.33 cm/s LVOT diam:     2.00 cm   LV E/e' lateral: 11.4 LV SV:         84 LV SV Index:   42 LVOT Area:     3.14 cm   RIGHT VENTRICLE             IVC RV S prime:     12.60 cm/s  IVC diam: 1.70 cm TAPSE (M-mode): 1.9 cm  LEFT ATRIUM             Index        RIGHT ATRIUM           Index LA diam:        4.40 cm 2.21 cm/m   RA Area:     13.40 cm LA Vol (A2C):   35.7 ml 17.93 ml/m  RA Volume:   27.80 ml  13.96 ml/m LA Vol (A4C):   47.3 ml 23.76 ml/m LA Biplane Vol: 42.8 ml 21.50 ml/m AORTIC VALVE LVOT Vmax:   109.00 cm/s LVOT Vmean:  76.000 cm/s LVOT VTI:    0.266 m  AORTA Ao Root diam: 3.40 cm Ao Asc diam:  3.20 cm Ao Desc diam: 2.40 cm  MITRAL VALVE               TRICUSPID VALVE MV Area (PHT): 3.19 cm    TR Peak grad:   17.8 mmHg MV Decel Time: 238 msec    TR Vmax:        211.00 cm/s MV E velocity: 60.60  cm/s MV A velocity: 98.50 cm/s  SHUNTS MV E/A ratio:  0.62        Systemic VTI:  0.27 m Systemic Diam: 2.00 cm  Gypsy Balsam MD Electronically signed by Gypsy Balsam MD Signature Date/Time: 11/01/2021/12:03:39 PM    Final             EKG Interpretation Date/Time:  Wednesday March 20 2023 11:00:19 EDT Ventricular Rate:  59 PR Interval:  164 QRS Duration:  88 QT Interval:  412 QTC Calculation: 407 R Axis:   42  Text Interpretation: Sinus bradycardia with Premature atrial complexes When compared with ECG of 25-Dec-2007 03:49, Premature atrial complexes are now Present Confirmed by Norman Herrlich ([id number]) on 03/20/2023  11:46:09 AM   Recent Labs: 09/17/2022: ALT 14; BUN 34; Creatinine, Ser 1.59; NT-Pro BNP 98; Potassium 4.7; Sodium 144  Recent Lipid Panel    Component Value Date/Time   CHOL 176 09/17/2022 1130   TRIG 180 (H) 09/17/2022 1130   HDL 37 (L) 09/17/2022 1130   CHOLHDL 4.8 (H) 09/17/2022 1130   CHOLHDL 7.4 12/24/2007 0920   VLDL 21 12/24/2007 0920   LDLCALC 107 (H) 09/17/2022 1130   LDLDIRECT 165.5 06/24/2007 1545    Physical Exam:    VS:  BP 135/84 (BP Location: Right Arm, Patient Position: Sitting, Cuff Size: Normal)   Pulse (!) 59   Ht 5\' 2"  (1.575 m)   Wt 208 lb (94.3 kg)   SpO2 96%   BMI 38.04 kg/m     Wt Readings from Last 3 Encounters:  03/20/23 208 lb (94.3 kg)  09/17/22 218 lb 9.6 oz (99.2 kg)  12/14/21 221 lb 3.2 oz (100.3 kg)     GEN:  Well nourished, well developed in no acute distress HEENT: Normal NECK: No JVD; No carotid bruits LYMPHATICS: No lymphadenopathy CARDIAC: RRR, no murmurs, rubs, gallops RESPIRATORY:  Clear to auscultation without rales, wheezing or rhonchi  ABDOMEN: Soft, non-tender, non-distended MUSCULOSKELETAL:  No edema; No deformity  SKIN: Warm and dry NEUROLOGIC:  Alert and oriented x 3 PSYCHIATRIC:  Normal affect    Signed, Norman Herrlich, MD  03/20/2023 11:49 AM    [facility]

## 2023-03-20 ENCOUNTER — Ambulatory Visit: Payer: Medicare Other | Attending: Cardiology | Admitting: Cardiology

## 2023-03-20 ENCOUNTER — Encounter: Payer: Self-pay | Admitting: Cardiology

## 2023-03-20 VITALS — BP 135/84 | HR 59 | Ht 62.0 in | Wt 208.0 lb

## 2023-03-20 DIAGNOSIS — E785 Hyperlipidemia, unspecified: Secondary | ICD-10-CM | POA: Insufficient documentation

## 2023-03-20 DIAGNOSIS — N183 Chronic kidney disease, stage 3 unspecified: Secondary | ICD-10-CM | POA: Insufficient documentation

## 2023-03-20 DIAGNOSIS — I110 Hypertensive heart disease with heart failure: Secondary | ICD-10-CM | POA: Diagnosis not present

## 2023-03-20 NOTE — Patient Instructions (Signed)

## 2023-08-03 ENCOUNTER — Encounter (HOSPITAL_BASED_OUTPATIENT_CLINIC_OR_DEPARTMENT_OTHER): Payer: Self-pay | Admitting: Emergency Medicine

## 2023-08-03 ENCOUNTER — Ambulatory Visit (HOSPITAL_BASED_OUTPATIENT_CLINIC_OR_DEPARTMENT_OTHER)
Admission: EM | Admit: 2023-08-03 | Discharge: 2023-08-03 | Disposition: A | Discharge status: Home / Self Care | Payer: Medicare Other | Attending: Family Medicine | Admitting: Family Medicine

## 2023-08-03 DIAGNOSIS — J4521 Mild intermittent asthma with (acute) exacerbation: Secondary | ICD-10-CM

## 2023-08-03 DIAGNOSIS — H60391 Other infective otitis externa, right ear: Secondary | ICD-10-CM

## 2023-08-03 DIAGNOSIS — E1122 Type 2 diabetes mellitus with diabetic chronic kidney disease: Secondary | ICD-10-CM | POA: Insufficient documentation

## 2023-08-03 DIAGNOSIS — R059 Cough, unspecified: Secondary | ICD-10-CM | POA: Diagnosis present

## 2023-08-03 DIAGNOSIS — J069 Acute upper respiratory infection, unspecified: Secondary | ICD-10-CM

## 2023-08-03 DIAGNOSIS — I509 Heart failure, unspecified: Secondary | ICD-10-CM | POA: Insufficient documentation

## 2023-08-03 DIAGNOSIS — R0602 Shortness of breath: Secondary | ICD-10-CM | POA: Diagnosis present

## 2023-08-03 DIAGNOSIS — N183 Chronic kidney disease, stage 3 unspecified: Secondary | ICD-10-CM | POA: Diagnosis not present

## 2023-08-03 DIAGNOSIS — I130 Hypertensive heart and chronic kidney disease with heart failure and stage 1 through stage 4 chronic kidney disease, or unspecified chronic kidney disease: Secondary | ICD-10-CM | POA: Diagnosis not present

## 2023-08-03 DIAGNOSIS — R509 Fever, unspecified: Secondary | ICD-10-CM | POA: Diagnosis present

## 2023-08-03 MED ORDER — PREDNISONE 20 MG PO TABS: 40.0000 mg | ORAL_TABLET | Freq: Every day | ORAL | 0 refills | Status: AC

## 2023-08-03 MED ORDER — OFLOXACIN 0.3 % OT SOLN: 5.0000 [drp] | Freq: Two times a day (BID) | OTIC | 0 refills | Status: AC

## 2023-08-03 MED ORDER — DOXYCYCLINE HYCLATE 100 MG PO CAPS: 100.0000 mg | ORAL_CAPSULE | Freq: Two times a day (BID) | ORAL | 0 refills | Status: AC

## 2023-08-03 MED ORDER — BENZONATATE 100 MG PO CAPS: 100.0000 mg | ORAL_CAPSULE | Freq: Three times a day (TID) | ORAL | 0 refills | Status: DC | PRN

## 2023-08-03 MED ORDER — ALBUTEROL SULFATE HFA 108 (90 BASE) MCG/ACT IN AERS: 2.0000 | INHALATION_SPRAY | RESPIRATORY_TRACT | 0 refills | Status: AC | PRN

## 2023-08-03 NOTE — ED Triage Notes (Signed)
Pt c/o runny nose, coughing, short of breath, intermittent fevers, right ear ache x 4 days. Pt has taken OTC mucinex.

## 2023-08-03 NOTE — Discharge Instructions (Addendum)
Albuterol inhaler--do 2 puffs every 4 hours as needed for shortness of breath or wheezing  Take prednisone 20 mg--2 daily for 5 days; this is for inflammation in lungs. It can make your sugars elevate.  Make sure drinking plenty of water and be as good as she can on your diet.  Take doxycycline 100 mg --1 capsule 2 times daily for 7 days; this is an antibiotic for potential middle ear infection  -Floxin eardrops-5 drops in the affected ear 2 times daily for 7 days; this is antibiotic for external otitis  Take benzonatate 100 mg, 1 tab every 8 hours as needed for cough.

## 2023-08-03 NOTE — ED Provider Notes (Signed)
[facility]    CSN: [id number] Arrival date & time: 08/03/23  1125      History   Chief Complaint No chief complaint on file.   HPI [name] is a 77 y.o. female.   HPI Here for cough and congestion has been going on since December 10.  She is also had some shortness of breath that is intermittent and some intermittent fevers, and measured.  Now her right ear is hurting and it does hurt to lie down on it.  No vomiting or diarrhea.  She has been wheezing some  She is allergic to amoxicillin and erythromycin among other things.  She is also allergic to codeine and aspirin.  Past medical history includes diabetes and chronic kidney disease.  Her last EGFR was 38 in May of this year, found in care everywhere. Past Medical History:  Diagnosis Date   Adjustment insomnia    ANKLE PAIN, RIGHT 07/24/2007   Qualifier: Diagnosis of  By: Tawanna Cooler MD, Eugenio Hoes    ASTHMA 11/07/2007   Qualifier: Diagnosis of  By: Renaldo Fiddler CMA, Leigh     Barrett's esophagus 08/2011   CHF (congestive heart failure) (HCC)    CHOLELITHIASIS 04/19/2008   Qualifier: Diagnosis of  By: Tawanna Cooler MD, Eugenio Hoes    COLONIC POLYPS, ADENOMATOUS, HX OF 12/09/2008   Qualifier: Diagnosis of  By: Misty Stanley CMA Duncan Dull), Amanda     DEPRESSION 03/21/2007   Qualifier: Diagnosis of  By: Tawanna Cooler RN, Alvino Chapel     DIABETES MELLITUS, TYPE II 12/02/2007   Qualifier: Diagnosis of  By: Tawanna Cooler MD, Eugenio Hoes    Diaphoresis 08/10/2013   Disturbance of skin sensation 10/10/2007   Qualifier: Diagnosis of  By: Tawanna Cooler MD, Eugenio Hoes    DYSPHAGIA 12/10/2008   Qualifier: Diagnosis of  By: Russella Dar MD Bronson Curb T    ESOPHAGEAL STRICTURE 12/09/2008   Qualifier: Diagnosis of  By: Misty Stanley CMA (AAMA), Amanda     Fibromyalgia    GASTROESOPHAGEAL REFLUX DISEASE 11/25/2007   Qualifier: Diagnosis of  By: Clydia Llano, Norchel     GOUT 12/02/2007   Qualifier: Diagnosis of  By: Tawanna Cooler MD, Eugenio Hoes    HEMORRHOIDS 12/09/2008   Qualifier:  Diagnosis of  By: Misty Stanley CMA (AAMA), Amanda     Hiatal hernia    HYPERLIPIDEMIA 03/21/2007   Qualifier: Diagnosis of  By: Everett Graff     HYPERTENSION 03/21/2007   Qualifier: Diagnosis of  By: Everett Graff     LUNG NODULE 11/10/2007   Qualifier: Diagnosis of  By: Maple Hudson MD, Clinton D    Myalgia and myositis    Obesity    OBSTRUCTIVE SLEEP APNEA 11/07/2007   Qualifier: Diagnosis of  By: Renaldo Fiddler CMA, Leigh     Ovarian cancer (HCC) 2009   UNSPECIFIED MYALGIA AND MYOSITIS 11/25/2007   Qualifier: Diagnosis of  By: Tawanna Cooler MD, Eugenio Hoes     Patient Active Problem List   Diagnosis Date Noted   CHF (congestive heart failure) (HCC) 12/11/2021   Sleep pattern disturbance 01/11/2020   Hyperlipidemia 09/22/2018   History of anaphylaxis 09/22/2018   Chronic kidney disease, stage 3 unspecified (HCC) 09/22/2018   Diaphoresis 08/10/2013   Adjustment insomnia 08/2011   DYSPHAGIA 12/10/2008   HEMORRHOIDS 12/09/2008   ESOPHAGEAL STRICTURE 12/09/2008   History of colonic polyps 12/09/2008   CHOLELITHIASIS 04/19/2008   DIABETES MELLITUS, TYPE II 12/02/2007   GOUT 12/02/2007   GASTROESOPHAGEAL REFLUX DISEASE 11/25/2007   GASTRITIS 11/25/2007   UNSPECIFIED  MYALGIA AND MYOSITIS 11/25/2007   LUNG NODULE 11/10/2007   OBSTRUCTIVE SLEEP APNEA 11/07/2007   ASTHMA 11/07/2007   Asthma 11/07/2007   DISTURBANCE OF SKIN SENSATION 10/10/2007   Ovarian cancer (HCC) 2009   ANKLE PAIN, RIGHT 07/24/2007   Dyslipidemia 03/21/2007   OBESITY NOS 03/21/2007   DEPRESSION 03/21/2007   HYPERTENSION 03/21/2007   HYPERLIPIDEMIA 03/21/2007    Past Surgical History:  Procedure Laterality Date   CESAREAN SECTION     X2   CHOLECYSTECTOMY     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      Home Medications    Prior to Admission medications    Medication Sig Start Date End Date Taking? Authorizing Provider  albuterol (VENTOLIN HFA) 108 (90 Base) MCG/ACT inhaler Inhale 2 puffs into the lungs every 4 (four) hours as needed for wheezing or shortness of breath. 08/03/23  Yes Zenia Resides, MD  benzonatate (TESSALON) 100 MG capsule Take 1 capsule (100 mg total) by mouth 3 (three) times daily as needed for cough. 08/03/23  Yes Zenia Resides, MD  doxycycline (VIBRAMYCIN) 100 MG capsule Take 1 capsule (100 mg total) by mouth 2 (two) times daily for 7 days. 08/03/23 08/10/23 Yes Song Garris, Janace Aris, MD  furosemide (LASIX) 20 MG tablet Take 1 tablet (20 mg total) by mouth 2 (two) times daily. Please take an extra furosemide every other day. 09/17/22  Yes Baldo Daub, MD  ofloxacin (FLOXIN) 0.3 % OTIC solution Place 5 drops into both ears 2 (two) times daily for 7 days. 08/03/23 08/10/23 Yes Lavaun Greenfield, Janace Aris, MD  predniSONE (DELTASONE) 20 MG tablet Take 2 tablets (40 mg total) by mouth daily with breakfast for 5 days. 08/03/23 08/08/23 Yes Eleana Tocco, Janace Aris, MD  Semaglutide,0.25 or 0.5MG /DOS, (OZEMPIC, 0.25 OR 0.5 MG/DOSE,) 2 MG/1.5ML SOPN Inject 0.5 mg into the skin once a week.   Yes [provider]  spironolactone (ALDACTONE) 25 MG tablet Take 25 mg by mouth daily.   Yes [provider]  allopurinol (ZYLOPRIM) 300 MG tablet Take 300 mg by mouth daily.      [provider]  EPINEPHrine 0.3 mg/0.3 mL IJ SOAJ injection Inject 0.3 mg into the skin once as needed for allergies. 07/31/15   [provider]  Magnesium 400 MG TABS Take 400 mg by mouth daily.    [provider]  Multiple Vitamins-Minerals (CENTRUM WOMEN PO) Take 1 tablet by mouth every morning.    [provider]  pantoprazole (PROTONIX) 40 MG tablet Take 1 tablet (40 mg total) by mouth 2 (two) times daily. 06/12/16   Esterwood, Amy S, PA-C    Family History Family History  Problem Relation Age of Onset   Breast  cancer Mother    Ovarian cancer Mother    Coronary artery disease Father    Diabetes Father    Heart attack Father    Multiple myeloma Sister    Heart attack Brother     Social History Social History   Tobacco Use   Smoking status: Never   Smokeless tobacco: Never  Substance Use Topics   Alcohol use: No   Drug use: No     Allergies   Aspirin, Codeine, Dapagliflozin, Erythromycin ethylsuccinate, Latex, and Penicillins   Review of Systems Review of Systems  Physical Exam Triage Vital Signs ED Triage Vitals  Encounter Vitals Group     BP 08/03/23 1135 132/77     Systolic BP Percentile --      Diastolic BP Percentile --      Pulse Rate 08/03/23 1135 61     Resp 08/03/23 1135 20     Temp 08/03/23 1135 98.2 F (36.8 C)     Temp Source 08/03/23 1135 Oral     SpO2 08/03/23 1135 95 %     Weight --      Height --      Head Circumference --      Peak Flow --      Pain Score 08/03/23 1133 3     Pain Loc --      Pain Education --      Exclude from Growth Chart --    No data found.  Updated Vital Signs BP 132/77 (BP Location: Right Arm)   Pulse 61   Temp 98.2 F (36.8 C) (Oral)   Resp 20   SpO2 95%   Visual Acuity Right Eye Distance:   Left Eye Distance:   Bilateral Distance:    Right Eye Near:   Left Eye Near:    Bilateral Near:     Physical Exam Vitals reviewed.  Constitutional:      General: She is not in acute distress.    Appearance: She is not toxic-appearing.  HENT:     Left Ear: Tympanic membrane and ear canal normal.     Ears:     Comments: There is some tenderness on traction of the right pinna and there is some white discharge in the canal with a little swelling of the right canal.  I cannot visualize the tympanic membrane adequately on the right.  Left tympanic membrane is gray and shiny and the canal is normal    Nose: Nose normal.     Mouth/Throat:     Mouth: Mucous membranes are moist.     Pharynx: No oropharyngeal exudate or  posterior oropharyngeal erythema.  Eyes:     Extraocular Movements: Extraocular movements intact.     Conjunctiva/sclera: Conjunctivae normal.     Pupils: Pupils are equal, round, and reactive to light.  Cardiovascular:     Rate and Rhythm: Normal rate and regular rhythm.     Heart sounds: No murmur heard. Pulmonary:     Effort: No respiratory distress.     Breath sounds: No stridor. No wheezing or rhonchi.     Comments: No wheezing heard at the time of exam, though breath sounds are overall coarse.  There is maybe a couple of crackles in the left lower lung field. Musculoskeletal:     Cervical back: Neck supple.  Lymphadenopathy:     Cervical: No cervical adenopathy.  Skin:    Capillary Refill: Capillary refill takes less than 2 seconds.     Coloration: Skin is not jaundiced or pale.  Neurological:     General: No focal deficit present.     Mental Status: She is alert and oriented to person, place, and time.  Psychiatric:        Behavior: Behavior normal.      UC Treatments / Results  Labs (all labs ordered are listed, but only abnormal results are displayed) Labs Reviewed  SARS CORONAVIRUS 2 (TAT 6-24 HRS)    EKG   Radiology No results found.  Procedures Procedures (including critical care time)  Medications Ordered in UC Medications -  No data to display  Initial Impression / Assessment and Plan / UC Course  I have reviewed the triage vital signs and the nursing notes.  Pertinent labs & imaging results that were available during my care of the patient were reviewed by me and considered in my medical decision making (see chart for details).     Doxycycline is sent in to treat potential otitis media that I cannot see plus minus potential pneumonia.  We do not have x-ray in the building and have advised the patient and her daughter of this.  Floxin is sent in for otitis externa.  5 days of prednisone and albuterol are sent in for asthma exacerbation   and  Tessalon Perles are sent in for cough.     COVID swab is done today and if positive she is a candidate for antiviral treatment.  She would be renal dosing of Paxlovid as her last EGFR was 38. Final Clinical Impressions(s) / UC Diagnoses   Final diagnoses:  Mild intermittent asthma with (acute) exacerbation  Infective otitis externa of right ear  Viral URI with cough     Discharge Instructions      Albuterol inhaler--do 2 puffs every 4 hours as needed for shortness of breath or wheezing  Take prednisone 20 mg--2 daily for 5 days; this is for inflammation in lungs. It can make your sugars elevate.  Make sure drinking plenty of water and be as good as she can on your diet.  Take doxycycline 100 mg --1 capsule 2 times daily for 7 days; this is an antibiotic for potential middle ear infection  -Floxin eardrops-5 drops in the affected ear 2 times daily for 7 days; this is antibiotic for external otitis  Take benzonatate 100 mg, 1 tab every 8 hours as needed for cough.      ED Prescriptions     Medication Sig Dispense Auth. Provider   albuterol (VENTOLIN HFA) 108 (90 Base) MCG/ACT inhaler Inhale 2 puffs into the lungs every 4 (four) hours as needed for wheezing or shortness of breath. 1 each Zenia Resides, MD   benzonatate (TESSALON) 100 MG capsule Take 1 capsule (100 mg total) by mouth 3 (three) times daily as needed for cough. 21 capsule Zenia Resides, MD   predniSONE (DELTASONE) 20 MG tablet Take 2 tablets (40 mg total) by mouth daily with breakfast for 5 days. 10 tablet Zenia Resides, MD   doxycycline (VIBRAMYCIN) 100 MG capsule Take 1 capsule (100 mg total) by mouth 2 (two) times daily for 7 days. 14 capsule Iyonnah Ferrante, Janace Aris, MD   ofloxacin (FLOXIN) 0.3 % OTIC solution Place 5 drops into both ears 2 (two) times daily for 7 days. 5 mL Zenia Resides, MD      PDMP not reviewed this encounter.   Zenia Resides, MD 08/03/23 1155

## 2023-08-04 LAB — SARS CORONAVIRUS 2 (TAT 6-24 HRS): SARS Coronavirus 2: NEGATIVE

## 2023-08-18 ENCOUNTER — Ambulatory Visit (HOSPITAL_BASED_OUTPATIENT_CLINIC_OR_DEPARTMENT_OTHER)
Admission: EM | Admit: 2023-08-18 | Discharge: 2023-08-18 | Disposition: A | Discharge status: Home / Self Care | Payer: Medicare Other | Attending: Internal Medicine | Admitting: Internal Medicine

## 2023-08-18 ENCOUNTER — Encounter (HOSPITAL_BASED_OUTPATIENT_CLINIC_OR_DEPARTMENT_OTHER): Payer: Self-pay | Admitting: Emergency Medicine

## 2023-08-18 DIAGNOSIS — R0600 Dyspnea, unspecified: Secondary | ICD-10-CM

## 2023-08-18 NOTE — Discharge Instructions (Addendum)
Go to the emergency department for further evaluation

## 2023-08-18 NOTE — ED Triage Notes (Signed)
Pt c/o not feeling better since being seen 3 weeks ago here pt states she took all prescribed medication, pt now short of breath, greenish phlegm, right ear still hurting .

## 2023-08-18 NOTE — ED Provider Notes (Signed)
[facility]    CSN: [id number] Arrival date & time: 08/18/23  1214      History   Chief Complaint No chief complaint on file.   HPI [name] is a 77 y.o. female.   HPI Has been sick for several weeks symptoms include cough, chest congestion and shortness of breath.  Seen 08/03/2023 treated with an inhaler, Tessalon, doxycycline, prednisone and antibiotic eardrops. States her chest symptoms have worsened.  Continues to cough, has occasional green sputum and is having dyspnea on exertion.  Denies chest pain.  Has history of congestive heart failure states her legs are always swollen without recent change, denies change in her weight.  Admits green sputum.   Past Medical History:  Diagnosis Date   Adjustment insomnia    ANKLE PAIN, RIGHT 07/24/2007   Qualifier: Diagnosis of  By: Tawanna Cooler MD, Eugenio Hoes    ASTHMA 11/07/2007   Qualifier: Diagnosis of  By: Renaldo Fiddler CMA, Leigh     Barrett's esophagus 08/2011   CHF (congestive heart failure) (HCC)    CHOLELITHIASIS 04/19/2008   Qualifier: Diagnosis of  By: Tawanna Cooler MD, Eugenio Hoes    COLONIC POLYPS, ADENOMATOUS, HX OF 12/09/2008   Qualifier: Diagnosis of  By: Misty Stanley CMA Duncan Dull), Amanda     DEPRESSION 03/21/2007   Qualifier: Diagnosis of  By: Tawanna Cooler RN, Alvino Chapel     DIABETES MELLITUS, TYPE II 12/02/2007   Qualifier: Diagnosis of  By: Tawanna Cooler MD, Eugenio Hoes    Diaphoresis 08/10/2013   Disturbance of skin sensation 10/10/2007   Qualifier: Diagnosis of  By: Tawanna Cooler MD, Eugenio Hoes    DYSPHAGIA 12/10/2008   Qualifier: Diagnosis of  By: Russella Dar MD Bronson Curb T    ESOPHAGEAL STRICTURE 12/09/2008   Qualifier: Diagnosis of  By: Misty Stanley CMA (AAMA), Amanda     Fibromyalgia    GASTROESOPHAGEAL REFLUX DISEASE 11/25/2007   Qualifier: Diagnosis of  By: Clydia Llano, Norchel     GOUT 12/02/2007   Qualifier: Diagnosis of  By: Tawanna Cooler MD, Eugenio Hoes    HEMORRHOIDS 12/09/2008   Qualifier: Diagnosis of  By: Misty Stanley CMA (AAMA), Amanda     Hiatal  hernia    HYPERLIPIDEMIA 03/21/2007   Qualifier: Diagnosis of  By: Everett Graff     HYPERTENSION 03/21/2007   Qualifier: Diagnosis of  By: Everett Graff     LUNG NODULE 11/10/2007   Qualifier: Diagnosis of  By: Maple Hudson MD, Clinton D    Myalgia and myositis    Obesity    OBSTRUCTIVE SLEEP APNEA 11/07/2007   Qualifier: Diagnosis of  By: Renaldo Fiddler CMA, Leigh     Ovarian cancer (HCC) 2009   UNSPECIFIED MYALGIA AND MYOSITIS 11/25/2007   Qualifier: Diagnosis of  By: Tawanna Cooler MD, Eugenio Hoes     Patient Active Problem List   Diagnosis Date Noted   CHF (congestive heart failure) (HCC) 12/11/2021   Sleep pattern disturbance 01/11/2020   Hyperlipidemia 09/22/2018   History of anaphylaxis 09/22/2018   Chronic kidney disease, stage 3 unspecified (HCC) 09/22/2018   Diaphoresis 08/10/2013   Adjustment insomnia 08/2011   DYSPHAGIA 12/10/2008   HEMORRHOIDS 12/09/2008   ESOPHAGEAL STRICTURE 12/09/2008   History of colonic polyps 12/09/2008   CHOLELITHIASIS 04/19/2008   DIABETES MELLITUS, TYPE II 12/02/2007   GOUT 12/02/2007   GASTROESOPHAGEAL REFLUX DISEASE 11/25/2007   GASTRITIS 11/25/2007   UNSPECIFIED MYALGIA AND MYOSITIS 11/25/2007   LUNG NODULE 11/10/2007   OBSTRUCTIVE SLEEP APNEA 11/07/2007   ASTHMA 11/07/2007   Asthma 11/07/2007  DISTURBANCE OF SKIN SENSATION 10/10/2007   Ovarian cancer (HCC) 2009   ANKLE PAIN, RIGHT 07/24/2007   Dyslipidemia 03/21/2007   OBESITY NOS 03/21/2007   DEPRESSION 03/21/2007   HYPERTENSION 03/21/2007   HYPERLIPIDEMIA 03/21/2007    Past Surgical History:  Procedure Laterality Date   CESAREAN SECTION     X2   CHOLECYSTECTOMY     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      Home Medications    Prior to Admission medications   Medication Sig Start Date End Date Taking? Authorizing Provider   albuterol (VENTOLIN HFA) 108 (90 Base) MCG/ACT inhaler Inhale 2 puffs into the lungs every 4 (four) hours as needed for wheezing or shortness of breath. 08/03/23   Zenia Resides, MD  allopurinol (ZYLOPRIM) 300 MG tablet Take 300 mg by mouth daily.      [provider]  benzonatate (TESSALON) 100 MG capsule Take 1 capsule (100 mg total) by mouth 3 (three) times daily as needed for cough. 08/03/23   Zenia Resides, MD  EPINEPHrine 0.3 mg/0.3 mL IJ SOAJ injection Inject 0.3 mg into the skin once as needed for allergies. 07/31/15   [provider]  furosemide (LASIX) 20 MG tablet Take 1 tablet (20 mg total) by mouth 2 (two) times daily. Please take an extra furosemide every other day. 09/17/22   Baldo Daub, MD  Magnesium 400 MG TABS Take 400 mg by mouth daily.    [provider]  Multiple Vitamins-Minerals (CENTRUM WOMEN PO) Take 1 tablet by mouth every morning.    [provider]  pantoprazole (PROTONIX) 40 MG tablet Take 1 tablet (40 mg total) by mouth 2 (two) times daily. 06/12/16   Esterwood, Amy S, PA-C  Semaglutide,0.25 or 0.5MG /DOS, (OZEMPIC, 0.25 OR 0.5 MG/DOSE,) 2 MG/1.5ML SOPN Inject 0.5 mg into the skin once a week.    [provider]  spironolactone (ALDACTONE) 25 MG tablet Take 25 mg by mouth daily.    [provider]    Family History Family History  Problem Relation Age of Onset   Breast cancer Mother    Ovarian cancer Mother    Coronary artery disease Father    Diabetes Father    Heart attack Father    Multiple myeloma Sister    Heart attack Brother     Social History Social History   Tobacco Use   Smoking status: Never   Smokeless tobacco: Never  Substance Use Topics   Alcohol use: No   Drug use: No     Allergies   Aspirin, Codeine, Dapagliflozin, Erythromycin ethylsuccinate, Latex, and Penicillins   Review of Systems Review of Systems   Physical Exam Triage Vital Signs ED Triage Vitals   Encounter Vitals Group     BP 08/18/23 1331 121/79     Systolic BP Percentile --      Diastolic BP Percentile --      Pulse Rate 08/18/23 1331 63     Resp 08/18/23 1331 18     Temp 08/18/23 1331 98.3 F (36.8 C)     Temp Source 08/18/23 1331 Oral     SpO2 08/18/23 1331 96 %     Weight --      Height --      Head Circumference --  Peak Flow --      Pain Score 08/18/23 1330 4     Pain Loc --      Pain Education --      Exclude from Growth Chart --    No data found.  Updated Vital Signs BP 121/79 (BP Location: Right Arm)   Pulse 63   Temp 98.3 F (36.8 C) (Oral)   Resp 18   SpO2 96%   Visual Acuity Right Eye Distance:   Left Eye Distance:   Bilateral Distance:    Right Eye Near:   Left Eye Near:    Bilateral Near:     Physical Exam Vitals and nursing note reviewed.  Constitutional:      Comments: She is dyspneic with speech  HENT:     Head: Normocephalic.     Right Ear: Tympanic membrane and ear canal normal.     Left Ear: Tympanic membrane and ear canal normal.  Cardiovascular:     Rate and Rhythm: Normal rate and regular rhythm.     Heart sounds: Normal heart sounds.  Pulmonary:     Effort: No respiratory distress.     Breath sounds: Rhonchi present. No wheezing or rales.     Comments: Increased respiratory rate Musculoskeletal:     Right lower leg: Edema present.     Left lower leg: Edema present.  Neurological:     Mental Status: She is alert.      UC Treatments / Results  Labs (all labs ordered are listed, but only abnormal results are displayed) Labs Reviewed - No data to display  EKG   Radiology No results found.  Procedures Procedures (including critical care time)  Medications Ordered in UC Medications - No data to display  Initial Impression / Assessment and Plan / UC Course  I have reviewed the triage vital signs and the nursing notes.  Pertinent labs & imaging results that were available during my care of the patient  were reviewed by me and considered in my medical decision making (see chart for details).     77 year old female with a history of congestive heart failure complaining of cough for 3 weeks now with dyspnea on exertion, denies chest pain.  Ambulatory pulse ox 96 to 98% reports feeling short of breath with ambulation.  Recommend urgent ED evaluation, concern for CHF exacerbation, pneumonia  Final diagnoses:  Dyspnea, unspecified type     Discharge Instructions      Go to the emergency department for further evaluation   ED Prescriptions   None    PDMP not reviewed this encounter.   Meliton Rattan, [place] 08/18/23 1347

## 2023-08-19 ENCOUNTER — Emergency Department (HOSPITAL_BASED_OUTPATIENT_CLINIC_OR_DEPARTMENT_OTHER): Payer: Medicare Other

## 2023-08-19 ENCOUNTER — Encounter (HOSPITAL_BASED_OUTPATIENT_CLINIC_OR_DEPARTMENT_OTHER): Payer: Self-pay | Admitting: Emergency Medicine

## 2023-08-19 ENCOUNTER — Emergency Department (HOSPITAL_BASED_OUTPATIENT_CLINIC_OR_DEPARTMENT_OTHER)
Admission: EM | Admit: 2023-08-19 | Discharge: 2023-08-19 | Disposition: A | Discharge status: Home / Self Care | Payer: Medicare Other | Attending: Emergency Medicine | Admitting: Emergency Medicine

## 2023-08-19 ENCOUNTER — Other Ambulatory Visit: Payer: Self-pay

## 2023-08-19 DIAGNOSIS — Z794 Long term (current) use of insulin: Secondary | ICD-10-CM | POA: Insufficient documentation

## 2023-08-19 DIAGNOSIS — E119 Type 2 diabetes mellitus without complications: Secondary | ICD-10-CM | POA: Insufficient documentation

## 2023-08-19 DIAGNOSIS — R600 Localized edema: Secondary | ICD-10-CM | POA: Diagnosis not present

## 2023-08-19 DIAGNOSIS — I509 Heart failure, unspecified: Secondary | ICD-10-CM | POA: Diagnosis not present

## 2023-08-19 DIAGNOSIS — J189 Pneumonia, unspecified organism: Secondary | ICD-10-CM | POA: Diagnosis not present

## 2023-08-19 DIAGNOSIS — J45909 Unspecified asthma, uncomplicated: Secondary | ICD-10-CM | POA: Diagnosis not present

## 2023-08-19 DIAGNOSIS — Z91040 Latex allergy status: Secondary | ICD-10-CM | POA: Diagnosis not present

## 2023-08-19 DIAGNOSIS — R0602 Shortness of breath: Secondary | ICD-10-CM | POA: Diagnosis present

## 2023-08-19 DIAGNOSIS — I110 Hypertensive heart disease with heart failure: Secondary | ICD-10-CM | POA: Diagnosis not present

## 2023-08-19 LAB — COMPREHENSIVE METABOLIC PANEL
ALT: 18 U/L (ref 0–44)
AST: 14 U/L — ABNORMAL LOW (ref 15–41)
Albumin: 3.5 g/dL (ref 3.5–5.0)
Alkaline Phosphatase: 64 U/L (ref 38–126)
Anion gap: 9 (ref 5–15)
BUN: 18 mg/dL (ref 8–23)
CO2: 25 mmol/L (ref 22–32)
Calcium: 9.1 mg/dL (ref 8.9–10.3)
Chloride: 104 mmol/L (ref 98–111)
Creatinine, Ser: 1.23 mg/dL — ABNORMAL HIGH (ref 0.44–1.00)
GFR, Estimated: 46 mL/min — ABNORMAL LOW (ref >60)
Glucose, Bld: 157 mg/dL — ABNORMAL HIGH (ref 70–99)
Potassium: 4.3 mmol/L (ref 3.5–5.1)
Sodium: 138 mmol/L (ref 135–145)
Total Bilirubin: 0.8 mg/dL (ref 0.0–1.2)
Total Protein: 6.4 g/dL — ABNORMAL LOW (ref 6.5–8.1)

## 2023-08-19 LAB — CBC WITH DIFFERENTIAL/PLATELET
Abs Immature Granulocytes: 0.05 10*3/uL (ref 0.00–0.07)
Basophils Absolute: 0.1 10*3/uL (ref 0.0–0.1)
Basophils Relative: 0 %
Eosinophils Absolute: 0.1 10*3/uL (ref 0.0–0.5)
Eosinophils Relative: 1 %
HCT: 39.1 % (ref 36.0–46.0)
Hemoglobin: 13.2 g/dL (ref 12.0–15.0)
Immature Granulocytes: 0 %
Lymphocytes Relative: 16 %
Lymphs Abs: 1.8 10*3/uL (ref 0.7–4.0)
MCH: 29.3 pg (ref 26.0–34.0)
MCHC: 33.8 g/dL (ref 30.0–36.0)
MCV: 86.9 fL (ref 80.0–100.0)
Monocytes Absolute: 1 10*3/uL (ref 0.1–1.0)
Monocytes Relative: 8 %
Neutro Abs: 8.4 10*3/uL — ABNORMAL HIGH (ref 1.7–7.7)
Neutrophils Relative %: 75 %
Platelets: 202 10*3/uL (ref 150–400)
RBC: 4.5 MIL/uL (ref 3.87–5.11)
RDW: 13.6 % (ref 11.5–15.5)
WBC: 11.4 10*3/uL — ABNORMAL HIGH (ref 4.0–10.5)
nRBC: 0 % (ref 0.0–0.2)

## 2023-08-19 LAB — BRAIN NATRIURETIC PEPTIDE: B Natriuretic Peptide: 32.4 pg/mL (ref 0.0–100.0)

## 2023-08-19 MED ORDER — LEVOFLOXACIN 250 MG PO TABS: 250.0000 mg | ORAL_TABLET | Freq: Every day | ORAL | 0 refills | Status: DC

## 2023-08-19 MED ORDER — IPRATROPIUM BROMIDE 0.02 % IN SOLN
0.5000 mg | Freq: Once | RESPIRATORY_TRACT | Status: AC
  Administered 2023-08-19: 0.5 mg via RESPIRATORY_TRACT
  Filled 2023-08-19: qty 2.5

## 2023-08-19 MED ORDER — ALBUTEROL SULFATE (2.5 MG/3ML) 0.083% IN NEBU
5.0000 mg | INHALATION_SOLUTION | Freq: Once | RESPIRATORY_TRACT | Status: AC
  Administered 2023-08-19: 5 mg via RESPIRATORY_TRACT
  Filled 2023-08-19: qty 6

## 2023-08-19 NOTE — ED Notes (Addendum)
RT Note: Patient oxygen saturation on room air while at rest = 99%, HR 56, RR 16 Patient oxygen saturation on room air while ambulating +92-93%, HR 65, RR18  Patient tolerated well

## 2023-08-19 NOTE — ED Notes (Signed)
Pt. Is alert and oriented with c/o [facility] and reports she feels like she is having a CHF exacerbation

## 2023-08-19 NOTE — ED Triage Notes (Signed)
Has been seen  for same 3 weeks  ago and then seen yesterday and was told to come here for  same has hx of CHF

## 2023-08-19 NOTE — ED Provider Notes (Signed)
[place] [facility] Provider Note   CSN: [id number] Arrival date & time: 08/19/23  1055     History  Chief Complaint  Patient presents with   Shortness of Breath    [name] is a 77 y.o. female.   Shortness of Breath Patient presents with shortness of breath.  Cough.  Has had for around 3 weeks.  Is been seen in urgent care twice.  Has been on antibiotics and steroids.  States continues to cough continues to have shortness of breath.  Does not think she be able to walk to the car right now.  Does have history of CHF and feels if she is somewhat volume overloaded.  States she took 20 mg of Lasix this morning but thinks she may have more fluid on her.  Some fluid on her legs but states that is not that much more than normal.    Past Medical History:  Diagnosis Date   Adjustment insomnia    ANKLE PAIN, RIGHT 07/24/2007   Qualifier: Diagnosis of  By: Tawanna Cooler MD, Eugenio Hoes    ASTHMA 11/07/2007   Qualifier: Diagnosis of  By: Renaldo Fiddler CMA, Leigh     Barrett's esophagus 08/2011   CHF (congestive heart failure) (HCC)    CHOLELITHIASIS 04/19/2008   Qualifier: Diagnosis of  By: Tawanna Cooler MD, Eugenio Hoes    COLONIC POLYPS, ADENOMATOUS, HX OF 12/09/2008   Qualifier: Diagnosis of  By: Misty Stanley CMA Duncan Dull), Amanda     DEPRESSION 03/21/2007   Qualifier: Diagnosis of  By: Tawanna Cooler RN, Alvino Chapel     DIABETES MELLITUS, TYPE II 12/02/2007   Qualifier: Diagnosis of  By: Tawanna Cooler MD, Eugenio Hoes    Diaphoresis 08/10/2013   Disturbance of skin sensation 10/10/2007   Qualifier: Diagnosis of  By: Tawanna Cooler MD, Eugenio Hoes    DYSPHAGIA 12/10/2008   Qualifier: Diagnosis of  By: Russella Dar MD Bronson Curb T    ESOPHAGEAL STRICTURE 12/09/2008   Qualifier: Diagnosis of  By: Misty Stanley CMA (AAMA), Amanda     Fibromyalgia    GASTROESOPHAGEAL REFLUX DISEASE 11/25/2007   Qualifier: Diagnosis of  By: Clydia Llano, Norchel     GOUT 12/02/2007   Qualifier: Diagnosis of  By: Tawanna Cooler MD, Eugenio Hoes    HEMORRHOIDS 12/09/2008   Qualifier: Diagnosis of  By: Misty Stanley CMA (AAMA), Amanda     Hiatal hernia    HYPERLIPIDEMIA 03/21/2007   Qualifier: Diagnosis of  By: Everett Graff     HYPERTENSION 03/21/2007   Qualifier: Diagnosis of  By: Everett Graff     LUNG NODULE 11/10/2007   Qualifier: Diagnosis of  By: Maple Hudson MD, Clinton D    Myalgia and myositis    Obesity    OBSTRUCTIVE SLEEP APNEA 11/07/2007   Qualifier: Diagnosis of  By: Renaldo Fiddler CMA, Leigh     Ovarian cancer (HCC) 2009   UNSPECIFIED MYALGIA AND MYOSITIS 11/25/2007   Qualifier: Diagnosis of  By: Tawanna Cooler MD, Eugenio Hoes     Home Medications Prior to Admission medications   Medication Sig Start Date End Date Taking? Authorizing Provider  levofloxacin (LEVAQUIN) 250 MG tablet Take 1 tablet (250 mg total) by mouth daily. 08/19/23  Yes Benjiman Core, MD  albuterol (VENTOLIN HFA) 108 (90 Base) MCG/ACT inhaler Inhale 2 puffs into the lungs every 4 (four) hours as needed for wheezing or shortness of breath. 08/03/23   Zenia Resides, MD  allopurinol (ZYLOPRIM) 300 MG tablet Take 300 mg by mouth daily.  [provider]  benzonatate (TESSALON) 100 MG capsule Take 1 capsule (100 mg total) by mouth 3 (three) times daily as needed for cough. 08/03/23   Zenia Resides, MD  EPINEPHrine 0.3 mg/0.3 mL IJ SOAJ injection Inject 0.3 mg into the skin once as needed for allergies. 07/31/15   [provider]  furosemide (LASIX) 20 MG tablet Take 1 tablet (20 mg total) by mouth 2 (two) times daily. Please take an extra furosemide every other day. 09/17/22   Baldo Daub, MD  Magnesium 400 MG TABS Take 400 mg by mouth daily.    [provider]  Multiple Vitamins-Minerals (CENTRUM WOMEN PO) Take 1 tablet by mouth every morning.    [provider]  pantoprazole (PROTONIX) 40 MG tablet Take 1 tablet (40 mg total) by mouth 2 (two) times daily. 06/12/16   Esterwood, Amy S, PA-C  Semaglutide,0.25 or  0.5MG /DOS, (OZEMPIC, 0.25 OR 0.5 MG/DOSE,) 2 MG/1.5ML SOPN Inject 0.5 mg into the skin once a week.    [provider]  spironolactone (ALDACTONE) 25 MG tablet Take 25 mg by mouth daily.    [provider]      Allergies    Aspirin, Codeine, Dapagliflozin, Erythromycin ethylsuccinate, Latex, and Penicillins    Review of Systems   Review of Systems  Respiratory:  Positive for shortness of breath.     Physical Exam Updated Vital Signs BP (!) 122/51   Pulse 75   Temp (!) 97.4 F (36.3 C)   Resp (!) 24   Ht 5\' 2"  (1.575 m)   Wt 97.3 kg   SpO2 93%   BMI 39.23 kg/m  Physical Exam Vitals and nursing note reviewed.  Cardiovascular:     Rate and Rhythm: Normal rate and regular rhythm.  Pulmonary:     Comments: Harsh breath sounds with some wheezes.  Greater on right than left however.  Worse at right base. Chest:     Chest wall: No tenderness.  Abdominal:     Tenderness: There is no abdominal tenderness.  Musculoskeletal:     Right lower leg: Edema present.     Left lower leg: Edema present.  Neurological:     Mental Status: She is alert.     ED Results / Procedures / Treatments   Labs (all labs ordered are listed, but only abnormal results are displayed) Labs Reviewed  COMPREHENSIVE METABOLIC PANEL - Abnormal; Notable for the following components:      Result Value   Glucose, Bld 157 (*)    Creatinine, Ser 1.23 (*)    Total Protein 6.4 (*)    AST 14 (*)    GFR, Estimated 46 (*)    All other components within normal limits  CBC WITH DIFFERENTIAL/PLATELET - Abnormal; Notable for the following components:   WBC 11.4 (*)    Neutro Abs 8.4 (*)    All other components within normal limits  BRAIN NATRIURETIC PEPTIDE    EKG None  Radiology DG Chest Portable 1 View Result Date: 08/19/2023 CLINICAL DATA:  Short of breath EXAM: PORTABLE CHEST 1 VIEW COMPARISON:  None Available. FINDINGS: Normal mediastinum and cardiac silhouette. Normal pulmonary  vasculature. No evidence of effusion, infiltrate, or pneumothorax. No acute bony abnormality. IMPRESSION: No acute cardiopulmonary process. Electronically Signed   By: Genevive Bi M.D.   On: 08/19/2023 12:18    Procedures Procedures    Medications Ordered in ED Medications  albuterol (PROVENTIL) (2.5 MG/3ML) 0.083% nebulizer solution 5 mg (5 mg Nebulization  Given 08/19/23 1253)  ipratropium (ATROVENT) nebulizer solution 0.5 mg (0.5 mg Nebulization Given 08/19/23 1254)    ED Course/ Medical Decision Making/ A&P                                 Medical Decision Making Amount and/or Complexity of Data Reviewed Labs: ordered. Radiology: ordered.  Risk Prescription drug management.   Patient with shortness of breath and cough.  Has had for around 3 weeks now.  Worsening shortness of breath.  Does have history of CHF.  No relief with steroids and doxycycline.  Reportedly had negative flu COVID and RSV testing yesterday.  Will get x-ray and basic blood work.  Does appear somewhat dyspneic.  Does have localizing findings to right lung particularly right base.  Blood work reassuring.  X-ray reassuring.  Normal BNP.  Able to ambulate without hypoxia.  However with localizing finding on auscultation will give antibiotics.  Discussed with pharmacist.  With having recent doxycycline and anaphylaxis to penicillin will give a renal reduced dose of Levaquin.  Follow-up with PCP.  Will discharge home        Final Clinical Impression(s) / ED Diagnoses Final diagnoses:  Community acquired pneumonia, unspecified laterality    Rx / DC Orders ED Discharge Orders          Ordered    levofloxacin (LEVAQUIN) 250 MG tablet  Daily        08/19/23 1347              Benjiman Core, MD 08/19/23 1419

## 2023-08-19 NOTE — ED Notes (Signed)
Reviewed discharge instructions, medications and follow up with pt. Pt states understanding. Pt ambulatory at discharge

## 2023-08-19 NOTE — ED Notes (Signed)
Patient denies pain and is resting comfortably.  

## 2023-09-17 NOTE — Progress Notes (Unsigned)
Cardiology Office Note:    Date:  09/18/2023   ID:  [name], DOB 04-May-1946, MRN [id number]  PCP:  [facility]  Cardiologist:  Norman Herrlich, MD    Referring MD: Buckner Malta, MD    ASSESSMENT:    1. Hypertensive heart disease with heart failure (HCC)   2. Stage 3 chronic kidney disease, unspecified whether stage 3a or 3b CKD (HCC)   3. Dyslipidemia   4. Obesity with serious comorbidity, unspecified class, unspecified obesity type    PLAN:    In order of problems listed above:  She has done quite well with medical therapy including symptom diet for weight loss has had a very impact on her sense of wellbeing hypertension and kidney disease.  Renal function is improved currently she takes the loop diuretic minimum dose every other day spironolactone which is quite renal active and does not require antihypertensive agents BP is at target. Improved kidney disease continue her current spironolactone and current diuretic Currently she is not on loop lowering medication I would not initiate them at this time Strongly encouraged to continue the semaglutide quite beneficial cardio renal and for prediabetes with morbid obesity   Next appointment: 6 months   Medication Adjustments/Labs and Tests Ordered: Current medicines are reviewed at length with the patient today.  Concerns regarding medicines are outlined above.  No orders of the defined types were placed in this encounter.  No orders of the defined types were placed in this encounter.    History of Present Illness:    [name] is a 78 y.o. female with a hx of hypertensive heart disease with heart failure stage III CKD dyslipidemia and morbid obesity with semaglutide therapy last seen 03/20/2023.  Compliance with diet, lifestyle and medications: Yes  She is doing quite well her weight is plateaued with semaglutide but she clearly is markedly improved she takes a loop diuretic on average every other  day she is not having edema shortness of breath orthopnea chest pain palpitation or syncope She has very good healthcare knowledge and her blood pressure at home runs on average 130/70 or less Recent labs 08/19/2023 renal function improved creatinine 1.23 hemoglobin 13.2 creatinine 4.3 her last lipid profile February 24 showed an LDL of 107 cholesterol 153 Past Medical History:  Diagnosis Date   Adjustment insomnia    ANKLE PAIN, RIGHT 07/24/2007   Qualifier: Diagnosis of  By: Tawanna Cooler MD, Eugenio Hoes    ASTHMA 11/07/2007   Qualifier: Diagnosis of   By: Renaldo Fiddler CMA, Leigh      Replacing diagnoses that were inactivated after the 11/19/22 regulatory import     Barrett's esophagus 08/2011   CHF (congestive heart failure) (HCC)    CHOLELITHIASIS 04/19/2008   Qualifier: Diagnosis of   By: Tawanna Cooler MD, Tinnie Gens A     Replacing diagnoses that were inactivated after the 11/19/22 regulatory import     Chronic kidney disease, stage 3 unspecified (HCC) 09/22/2018   COLONIC POLYPS, ADENOMATOUS, HX OF 12/09/2008   Qualifier: Diagnosis of  By: Misty Stanley CMA Duncan Dull), Amanda     DEPRESSION 03/21/2007   Qualifier: Diagnosis of  By: Tawanna Cooler RN, Alvino Chapel     DIABETES MELLITUS, TYPE II 12/02/2007   Qualifier: Diagnosis of  By: Tawanna Cooler MD, Eugenio Hoes    Diaphoresis 08/10/2013   Disturbance of skin sensation 10/10/2007   Qualifier: Diagnosis of  By: Tawanna Cooler MD, Eugenio Hoes    Dyslipidemia 03/21/2007   Qualifier: Diagnosis of   By: Everett Graff  DYSPHAGIA 12/10/2008   Qualifier: Diagnosis of  By: Russella Dar MD Bronson Curb T    ESOPHAGEAL STRICTURE 12/09/2008   Qualifier: Diagnosis of  By: Misty Stanley CMA (AAMA), Amanda     Fibromyalgia    GASTRITIS 11/25/2007   Qualifier: Diagnosis of   By: Clydia Llano, Norchel      Replacing diagnoses that were inactivated after the 11/19/22 regulatory import     GASTROESOPHAGEAL REFLUX DISEASE 11/25/2007   Qualifier: Diagnosis of  By: Clydia Llano, Norchel     GOUT 12/02/2007    Qualifier: Diagnosis of  By: Tawanna Cooler MD, Eugenio Hoes    HEMORRHOIDS 12/09/2008   Qualifier: Diagnosis of   By: Misty Stanley CMA (AAMA), Amanda      Replacing diagnoses that were inactivated after the 11/19/22 regulatory import     History of anaphylaxis 09/22/2018   History of colonic polyps 12/09/2008   Qualifier: Diagnosis of   By: Misty Stanley CMA Duncan Dull), Amanda      IMO SNOMED Dx Update Oct 2024     Hyperlipidemia 09/22/2018   HYPERTENSION 03/21/2007   Qualifier: Diagnosis of   By: Tawanna Cooler, RN, Alvino Chapel      Replacing diagnoses that were inactivated after the 11/19/22 regulatory import     LUNG NODULE 11/10/2007   Qualifier: Diagnosis of  By: Maple Hudson MD, Clinton D    OBESITY NOS 03/21/2007   Qualifier: Diagnosis of   By: Everett Graff         OBSTRUCTIVE SLEEP APNEA 11/07/2007   Qualifier: Diagnosis of  By: Renaldo Fiddler CMA, Leigh     Ovarian cancer (HCC) 2009   Sleep pattern disturbance 01/11/2020   UNSPECIFIED MYALGIA AND MYOSITIS 11/25/2007   Qualifier: Diagnosis of  By: Tawanna Cooler MD, Eugenio Hoes     Current Medications: Current Meds  Medication Sig   albuterol (VENTOLIN HFA) 108 (90 Base) MCG/ACT inhaler Inhale 2 puffs into the lungs every 4 (four) hours as needed for wheezing or shortness of breath.   allopurinol (ZYLOPRIM) 300 MG tablet Take 300 mg by mouth daily.     benzonatate (TESSALON) 100 MG capsule Take 1 capsule (100 mg total) by mouth 3 (three) times daily as needed for cough.   EPINEPHrine 0.3 mg/0.3 mL IJ SOAJ injection Inject 0.3 mg into the skin once as needed for allergies.   furosemide (LASIX) 20 MG tablet Take 20 mg by mouth daily.   levofloxacin (LEVAQUIN) 250 MG tablet Take 1 tablet (250 mg total) by mouth daily.   Magnesium 400 MG TABS Take 400 mg by mouth daily.   Multiple Vitamins-Minerals (CENTRUM WOMEN PO) Take 1 tablet by mouth every morning.   pantoprazole (PROTONIX) 40 MG tablet Take 1 tablet (40 mg total) by mouth 2 (two) times daily.   Semaglutide,0.25 or 0.5MG /DOS, (OZEMPIC, 0.25  OR 0.5 MG/DOSE,) 2 MG/1.5ML SOPN Inject 0.5 mg into the skin once a week.   spironolactone (ALDACTONE) 25 MG tablet Take 25 mg by mouth daily.      EKGs/Labs/Other Studies Reviewed:    The following studies were reviewed today:  Cardiac Studies & Procedures      ECHOCARDIOGRAM  ECHOCARDIOGRAM COMPLETE 11/01/2021  Narrative ECHOCARDIOGRAM REPORT    Patient Name:   LARESSA BOLINGER Date of Exam: 11/01/2021 Medical Rec #:  [id number]      Height:       62.0 in Accession #:    [id number]     Weight:       220.0 lb Date of Birth:  26-Aug-1945  BSA:          1.991 m Patient Age:    75 years       BP:           168/79 mmHg Patient Gender: F              HR:           61 bpm. Exam Location:  [facility]  Procedure: 2D Echo, Cardiac Doppler, Color Doppler and Strain Analysis  Indications:    CHF-Acute Diastolic I50.31  History:        Patient has no prior history of Echocardiogram examinations. Risk Factors:Hypertension, Diabetes and Dyslipidemia.  Sonographer:    Louie Boston RDCS Referring Phys: [id number] Breunna Nordmann J Arles Rumbold  IMPRESSIONS   1. GLS -12.4. Left ventricular ejection fraction, by estimation, is 60 to 65%. The left ventricle has normal function. The left ventricle has no regional wall motion abnormalities. There is mild left ventricular hypertrophy. Left ventricular diastolic parameters are consistent with Grade I diastolic dysfunction (impaired relaxation). 2. Right ventricular systolic function is normal. The right ventricular size is normal. There is normal pulmonary artery systolic pressure. 3. The mitral valve is normal in structure. No evidence of mitral valve regurgitation. No evidence of mitral stenosis. 4. The aortic valve is normal in structure. Aortic valve regurgitation is not visualized. No aortic stenosis is present. 5. The inferior vena cava is normal in size with greater than 50% respiratory variability, suggesting right atrial pressure of 3 mmHg.  FINDINGS Left  Ventricle: GLS -12.4. Left ventricular ejection fraction, by estimation, is 60 to 65%. The left ventricle has normal function. The left ventricle has no regional wall motion abnormalities. The left ventricular internal cavity size was normal in size. There is mild left ventricular hypertrophy. Left ventricular diastolic parameters are consistent with Grade I diastolic dysfunction (impaired relaxation).  Right Ventricle: The right ventricular size is normal. No increase in right ventricular wall thickness. Right ventricular systolic function is normal. There is normal pulmonary artery systolic pressure. The tricuspid regurgitant velocity is 2.11 m/s, and with an assumed right atrial pressure of 3 mmHg, the estimated right ventricular systolic pressure is 20.8 mmHg.  Left Atrium: Left atrial size was normal in size.  Right Atrium: Right atrial size was normal in size.  Pericardium: There is no evidence of pericardial effusion.  Mitral Valve: The mitral valve is normal in structure. No evidence of mitral valve regurgitation. No evidence of mitral valve stenosis.  Tricuspid Valve: The tricuspid valve is normal in structure. Tricuspid valve regurgitation is mild . No evidence of tricuspid stenosis.  Aortic Valve: The aortic valve is normal in structure. Aortic valve regurgitation is not visualized. No aortic stenosis is present.  Pulmonic Valve: The pulmonic valve was normal in structure. Pulmonic valve regurgitation is not visualized. No evidence of pulmonic stenosis.  Aorta: The aortic root is normal in size and structure.  Venous: The inferior vena cava is normal in size with greater than 50% respiratory variability, suggesting right atrial pressure of 3 mmHg.  IAS/Shunts: No atrial level shunt detected by color flow Doppler.   LEFT VENTRICLE PLAX 2D LVIDd:         4.70 cm   Diastology LVIDs:         2.70 cm   LV e' medial:    5.22 cm/s LV PW:         1.20 cm   LV E/e' medial:  11.6 LV  IVS:  1.20 cm   LV e' lateral:   5.33 cm/s LVOT diam:     2.00 cm   LV E/e' lateral: 11.4 LV SV:         84 LV SV Index:   42 LVOT Area:     3.14 cm   RIGHT VENTRICLE             IVC RV S prime:     12.60 cm/s  IVC diam: 1.70 cm TAPSE (M-mode): 1.9 cm  LEFT ATRIUM             Index        RIGHT ATRIUM           Index LA diam:        4.40 cm 2.21 cm/m   RA Area:     13.40 cm LA Vol (A2C):   35.7 ml 17.93 ml/m  RA Volume:   27.80 ml  13.96 ml/m LA Vol (A4C):   47.3 ml 23.76 ml/m LA Biplane Vol: 42.8 ml 21.50 ml/m AORTIC VALVE LVOT Vmax:   109.00 cm/s LVOT Vmean:  76.000 cm/s LVOT VTI:    0.266 m  AORTA Ao Root diam: 3.40 cm Ao Asc diam:  3.20 cm Ao Desc diam: 2.40 cm  MITRAL VALVE               TRICUSPID VALVE MV Area (PHT): 3.19 cm    TR Peak grad:   17.8 mmHg MV Decel Time: 238 msec    TR Vmax:        211.00 cm/s MV E velocity: 60.60 cm/s MV A velocity: 98.50 cm/s  SHUNTS MV E/A ratio:  0.62        Systemic VTI:  0.27 m Systemic Diam: 2.00 cm  Gypsy Balsam MD Electronically signed by Gypsy Balsam MD Signature Date/Time: 11/01/2021/12:03:39 PM    Final                 Recent Labs:  Physical Exam:    VS:  BP (!) 158/92   Pulse 80   Ht 5\' 2"  (1.575 m)   Wt 209 lb (94.8 kg)   SpO2 97%   BMI 38.23 kg/m     Wt Readings from Last 3 Encounters:  09/18/23 209 lb (94.8 kg)  08/19/23 214 lb 8.1 oz (97.3 kg)  03/20/23 208 lb (94.3 kg)     GEN:  Well nourished, well developed in no acute distress HEENT: Normal NECK: No JVD; No carotid bruits LYMPHATICS: No lymphadenopathy CARDIAC: RRR, no murmurs, rubs, gallops RESPIRATORY:  Clear to auscultation without rales, wheezing or rhonchi  ABDOMEN: Soft, non-tender, non-distended MUSCULOSKELETAL:  No edema; No deformity  SKIN: Warm and dry NEUROLOGIC:  Alert and oriented x 3 PSYCHIATRIC:  Normal affect    Signed, Norman Herrlich, MD  09/18/2023 11:03 AM    [facility]

## 2023-09-18 ENCOUNTER — Ambulatory Visit: Payer: Medicare Other | Attending: Cardiology | Admitting: Cardiology

## 2023-09-18 ENCOUNTER — Encounter: Payer: Self-pay | Admitting: Cardiology

## 2023-09-18 VITALS — BP 158/92 | HR 80 | Ht 62.0 in | Wt 209.0 lb

## 2023-09-18 DIAGNOSIS — E785 Hyperlipidemia, unspecified: Secondary | ICD-10-CM

## 2023-09-18 DIAGNOSIS — I110 Hypertensive heart disease with heart failure: Secondary | ICD-10-CM | POA: Diagnosis present

## 2023-09-18 DIAGNOSIS — N183 Chronic kidney disease, stage 3 unspecified: Secondary | ICD-10-CM

## 2023-09-18 DIAGNOSIS — E669 Obesity, unspecified: Secondary | ICD-10-CM

## 2023-09-18 NOTE — Patient Instructions (Signed)
Medication Instructions:  Your physician recommends that you continue on your current medications as directed. Please refer to the Current Medication list given to you today.  *If you need a refill on your cardiac medications before your next appointment, please call your pharmacy*   Lab Work: None If you have labs (blood work) drawn today and your tests are completely normal, you will receive your results only by: MyChart Message (if you have MyChart) OR A paper copy in the mail If you have any lab test that is abnormal or we need to change your treatment, we will call you to review the results.   Testing/Procedures: None   Follow-Up: At [facility], you and your health needs are our priority.  As part of our continuing mission to provide you with exceptional heart care, we have created designated Provider Care Teams.  These Care Teams include your primary Cardiologist (physician) and Advanced Practice Providers (APPs -  Physician Assistants and Nurse Practitioners) who all work together to provide you with the care you need, when you need it.  We recommend signing up for the patient portal called "MyChart".  Sign up information is provided on this After Visit Summary.  MyChart is used to connect with patients for Virtual Visits (Telemedicine).  Patients are able to view lab/test results, encounter notes, upcoming appointments, etc.  Non-urgent messages can be sent to your provider as well.   To learn more about what you can do with MyChart, go to [email or web address].    Your next appointment:   6 month(s)  Provider:   Norman Herrlich, MD    Other Instructions None

## 2023-10-17 ENCOUNTER — Emergency Department (HOSPITAL_BASED_OUTPATIENT_CLINIC_OR_DEPARTMENT_OTHER): Payer: Medicare Other

## 2023-10-17 ENCOUNTER — Other Ambulatory Visit: Payer: Self-pay

## 2023-10-17 ENCOUNTER — Emergency Department (HOSPITAL_BASED_OUTPATIENT_CLINIC_OR_DEPARTMENT_OTHER)
Admission: EM | Admit: 2023-10-17 | Discharge: 2023-10-17 | Disposition: A | Discharge status: Home / Self Care | Payer: Medicare Other | Attending: Emergency Medicine | Admitting: Emergency Medicine

## 2023-10-17 ENCOUNTER — Encounter (HOSPITAL_BASED_OUTPATIENT_CLINIC_OR_DEPARTMENT_OTHER): Payer: Self-pay

## 2023-10-17 DIAGNOSIS — Z91040 Latex allergy status: Secondary | ICD-10-CM | POA: Insufficient documentation

## 2023-10-17 DIAGNOSIS — I509 Heart failure, unspecified: Secondary | ICD-10-CM | POA: Diagnosis not present

## 2023-10-17 DIAGNOSIS — E119 Type 2 diabetes mellitus without complications: Secondary | ICD-10-CM | POA: Insufficient documentation

## 2023-10-17 DIAGNOSIS — R079 Chest pain, unspecified: Secondary | ICD-10-CM | POA: Insufficient documentation

## 2023-10-17 LAB — TROPONIN I (HIGH SENSITIVITY)
Troponin I (High Sensitivity): 4 ng/L (ref <18)
Troponin I (High Sensitivity): 4 ng/L (ref <18)

## 2023-10-17 LAB — CBC
HCT: 41.2 % (ref 36.0–46.0)
Hemoglobin: 14 g/dL (ref 12.0–15.0)
MCH: 29.4 pg (ref 26.0–34.0)
MCHC: 34 g/dL (ref 30.0–36.0)
MCV: 86.6 fL (ref 80.0–100.0)
Platelets: 234 10*3/uL (ref 150–400)
RBC: 4.76 MIL/uL (ref 3.87–5.11)
RDW: 14.2 % (ref 11.5–15.5)
WBC: 10.6 10*3/uL — ABNORMAL HIGH (ref 4.0–10.5)
nRBC: 0 % (ref 0.0–0.2)

## 2023-10-17 LAB — BASIC METABOLIC PANEL
Anion gap: 13 (ref 5–15)
BUN: 21 mg/dL (ref 8–23)
CO2: 23 mmol/L (ref 22–32)
Calcium: 9.2 mg/dL (ref 8.9–10.3)
Chloride: 103 mmol/L (ref 98–111)
Creatinine, Ser: 1.41 mg/dL — ABNORMAL HIGH (ref 0.44–1.00)
GFR, Estimated: 39 mL/min — ABNORMAL LOW (ref >60)
Glucose, Bld: 140 mg/dL — ABNORMAL HIGH (ref 70–99)
Potassium: 4 mmol/L (ref 3.5–5.1)
Sodium: 139 mmol/L (ref 135–145)

## 2023-10-17 MED ORDER — IOHEXOL 350 MG/ML SOLN
75.0000 mL | Freq: Once | INTRAVENOUS | Status: DC | PRN
Start: 2023-10-17 — End: 2023-10-17

## 2023-10-17 MED ORDER — IOHEXOL 350 MG/ML SOLN
60.0000 mL | Freq: Once | INTRAVENOUS | Status: AC | PRN
  Administered 2023-10-17: 60 mL via INTRAVENOUS

## 2023-10-17 MED ORDER — SODIUM CHLORIDE 0.9 % IV BOLUS
1000.0000 mL | Freq: Once | INTRAVENOUS | Status: AC
  Administered 2023-10-17: 1000 mL via INTRAVENOUS

## 2023-10-17 MED ORDER — MORPHINE SULFATE (PF) 4 MG/ML IV SOLN
4.0000 mg | Freq: Once | INTRAVENOUS | Status: AC
  Administered 2023-10-17: 4 mg via INTRAVENOUS
  Filled 2023-10-17: qty 1

## 2023-10-17 MED ORDER — ONDANSETRON HCL 4 MG/2ML IJ SOLN
4.0000 mg | Freq: Once | INTRAMUSCULAR | Status: AC
  Administered 2023-10-17: 4 mg via INTRAVENOUS
  Filled 2023-10-17: qty 2

## 2023-10-17 NOTE — ED Provider Notes (Signed)
 [facility] Provider Note   CSN: [id number] Arrival date & time: 10/17/23  0041     History  Chief Complaint  Patient presents with   Chest Pain    [name] is a 78 y.o. female.  Patient is a 78 year old female with past medical history of CHF, diabetes, hyperlipidemia, GERD, chronic renal insufficiency.  Patient presenting today with complaints of chest pain.  She was talking on the phone at approximately 11 PM when she developed the sudden onset of severe pain behind her left breast and radiating into her back.  The sharp pain lasted for approximately 15 minutes, then changed into a pressure.  This radiates into her left arm and she feels short of breath.  No nausea or diaphoresis.  Patient does report recent travel to [place] to visit a family member.  The history is provided by the patient.       Home Medications Prior to Admission medications   Medication Sig Start Date End Date Taking? Authorizing Provider  albuterol (VENTOLIN HFA) 108 (90 Base) MCG/ACT inhaler Inhale 2 puffs into the lungs every 4 (four) hours as needed for wheezing or shortness of breath. 08/03/23   Zenia Resides, MD  allopurinol (ZYLOPRIM) 300 MG tablet Take 300 mg by mouth daily.      [provider]  benzonatate (TESSALON) 100 MG capsule Take 1 capsule (100 mg total) by mouth 3 (three) times daily as needed for cough. 08/03/23   Zenia Resides, MD  EPINEPHrine 0.3 mg/0.3 mL IJ SOAJ injection Inject 0.3 mg into the skin once as needed for allergies. 07/31/15   [provider]  furosemide (LASIX) 20 MG tablet Take 20 mg by mouth daily.    [provider]  levofloxacin (LEVAQUIN) 250 MG tablet Take 1 tablet (250 mg total) by mouth daily. 08/19/23   Benjiman Core, MD  Magnesium 400 MG TABS Take 400 mg by mouth daily.    [provider]  Multiple Vitamins-Minerals (CENTRUM WOMEN PO) Take 1 tablet by mouth every  morning.    [provider]  pantoprazole (PROTONIX) 40 MG tablet Take 1 tablet (40 mg total) by mouth 2 (two) times daily. 06/12/16   Esterwood, Amy S, PA-C  Semaglutide,0.25 or 0.5MG /DOS, (OZEMPIC, 0.25 OR 0.5 MG/DOSE,) 2 MG/1.5ML SOPN Inject 0.5 mg into the skin once a week.    [provider]  spironolactone (ALDACTONE) 25 MG tablet Take 25 mg by mouth daily.    [provider]      Allergies    Aspirin, Codeine, Dapagliflozin, Erythromycin ethylsuccinate, Latex, and Penicillins    Review of Systems   Review of Systems  All other systems reviewed and are negative.   Physical Exam Updated Vital Signs BP (!) 140/59   Pulse 70   Temp (!) 97.5 F (36.4 C) (Oral)   Resp 18   Ht 5\' 3"  (1.6 m)   Wt 93.4 kg   SpO2 100%   BMI 36.49 kg/m  Physical Exam Vitals and nursing note reviewed.  Constitutional:      General: She is not in acute distress.    Appearance: She is well-developed. She is not diaphoretic.  HENT:     Head: Normocephalic and atraumatic.  Cardiovascular:     Rate and Rhythm: Normal rate and regular rhythm.     Heart sounds: No murmur heard.    No friction rub. No gallop.  Pulmonary:     Effort: Pulmonary effort  is normal. No respiratory distress.     Breath sounds: Normal breath sounds. No wheezing.  Abdominal:     General: Bowel sounds are normal. There is no distension.     Palpations: Abdomen is soft.     Tenderness: There is no abdominal tenderness.  Musculoskeletal:        General: Normal range of motion.     Cervical back: Normal range of motion and neck supple.     Right lower leg: No tenderness. No edema.     Left lower leg: No tenderness. No edema.  Skin:    General: Skin is warm and dry.  Neurological:     General: No focal deficit present.     Mental Status: She is alert and oriented to person, place, and time.     ED Results / Procedures / Treatments   Labs (all labs ordered are listed, but only abnormal  results are displayed) Labs Reviewed  CBC - Abnormal; Notable for the following components:      Result Value   WBC 10.6 (*)    All other components within normal limits  BASIC METABOLIC PANEL  TROPONIN I (HIGH SENSITIVITY)    EKG EKG Interpretation Date/Time:  Thursday October 17 2023 00:50:17 EST Ventricular Rate:  70 PR Interval:  192 QRS Duration:  110 QT Interval:  434 QTC Calculation: 469 R Axis:   58  Text Interpretation: Sinus rhythm Low voltage, precordial leads Baseline wander in lead(s) V1 No significant change since 08/19/2023 Confirmed by Geoffery Lyons ([id number]) on 10/17/2023 12:52:17 AM  Radiology No results found.  Procedures Procedures    Medications Ordered in ED Medications  morphine (PF) 4 MG/ML injection 4 mg (has no administration in time range)  ondansetron (ZOFRAN) injection 4 mg (has no administration in time range)    ED Course/ Medical Decision Making/ A&P  Patient is a 78 year old female presenting with complaints of chest pain as described in the HPI.  Patient arrives here with stable vital signs and is afebrile.  Physical examination basically unremarkable except for reproducible tenderness over the left lower anterior ribs.  Laboratory studies obtained including CBC, metabolic panel, and troponin, all of which are unremarkable.  Repeat troponin also negative.  Chest x-ray shows no acute process and CTA of the chest shows no evidence for pulmonary emboli and stable subpleural nodules.  No other acute process identified.  Patient given IV fluids and morphine for pain and Zofran for nausea and is feeling markedly improved.  At this point, nothing seems emergent.  I feel as though I have ruled out emergent pathology such as acute coronary syndrome, pulmonary embolism, aortic dissection, and pneumothorax.  I feels the patient can safely be discharged with as needed return.  Final Clinical Impression(s) / ED Diagnoses Final diagnoses:  None     Rx / DC Orders ED Discharge Orders     None         Geoffery Lyons, MD 10/17/23 [phone]

## 2023-10-17 NOTE — Discharge Instructions (Signed)
 Take Tylenol 1000 mg every 6 hours as needed for pain.  Rest.  Return to the emergency department if you develop worsening pain, difficulty breathing, high fevers, or for other new and concerning symptoms.

## 2023-10-17 NOTE — ED Notes (Signed)
 Patient taken to CT.

## 2023-10-17 NOTE — ED Triage Notes (Signed)
 Pt states that she began to have L sided CP that radiates to her L arm and shoulder with SOB, denies n/v.

## 2023-11-18 DIAGNOSIS — M545 Low back pain, unspecified: Secondary | ICD-10-CM | POA: Insufficient documentation

## 2024-02-13 ENCOUNTER — Telehealth: Payer: Self-pay | Admitting: Cardiology

## 2024-02-13 MED ORDER — FUROSEMIDE 20 MG PO TABS
20.0000 mg | ORAL_TABLET | Freq: Every day | ORAL | 1 refills | Status: DC
Start: 2024-02-13 — End: 2024-04-24

## 2024-02-13 NOTE — Telephone Encounter (Signed)
*  STAT* If patient is at the pharmacy, call can be transferred to refill team.   1. Which medications need to be refilled? (please list name of each medication and dose if known) furosemide  (LASIX ) 20 MG tablet    2. Would you like to learn more about the convenience, safety, & potential cost savings by using the [facility]? N/A     3. Are you open to using the [facility] (Type [facility]. N/A   4. Which pharmacy/location (including street and city if local pharmacy) is medication to be sent to?  [facility] - [place], [place] - [address]     5. Do they need a 30 day or 90 day supply? 90 day  Patient only has a weeks worth of med left.

## 2024-02-13 NOTE — Telephone Encounter (Signed)
 Pt's medication was sent to pt's pharmacy as requested. Confirmation received.

## 2024-02-17 NOTE — Progress Notes (Unsigned)
 Cardiology Office Note   Date:  02/18/2024  ID:  [name], DOB 06/20/1946, MRN [id number] PCP: [facility] Providers Cardiologist:  Redell Leiter, MD     History of Present Illness [name] is a 78 y.o. female with past medical history of HFpEF, CKD stage III--followed by [facility] nephrology, diabetes, gout  11/01/2021 echo EF 60-65%, concentric LVH, grade 1 DD  She established with Dr. Leiter on 10/18/2021 for the evaluation of heart failure with a past of her PCP.  She previously resided in [place]  but they recently relocated back to [place]  and had been having issues with controlling her blood pressure, her torsemide was transition back to furosemide  and had issues tolerating SGLT2 inhibitor secondary to syncope.  A repeat echocardiogram was arranged which was overall stable.  Most recently she was evaluated by Dr. Leiter on 09/18/2023, stable from a cardiac perspective no changes were made to her medications or plan of care and she was advised to follow-up in 6 months.  She was evaluated in the emergency department in February 2025 for an episode of chest pain, troponins were negative, CT of her chest revealed no episodes of PE.  She states today with concerns of DOE that have been progressive over the last 6 to 8 months.  She states it is most notable when she is giving a presentation, has also had people comment that she is having short of breath.  She has lost some weight intentionally over the last few months.  She has also noticed that she is needing to sleep on approximately 3 pillows at night when previously she just slept on 1.  She has good healthcare knowledge, she is a retired Engineer, civil (consulting), caregiver for her husband. She denies chest pain, palpitations,  pnd, n, v, dizziness, syncope, edema, weight gain, or early satiety.   ROS: Review of Systems  Respiratory:  Positive for shortness of breath.   All other systems reviewed and are negative.     Studies Reviewed      Cardiac Studies & Procedures   ______________________________________________________________________________________________     ECHOCARDIOGRAM  ECHOCARDIOGRAM COMPLETE 11/01/2021  Narrative ECHOCARDIOGRAM REPORT    Patient Name:   [name] Date of Exam: 11/01/2021 Medical Rec #:  [id number]      Height:       62.0 in Accession #:    [id number]     Weight:       220.0 lb Date of Birth:  Apr 04, 1946      BSA:          1.991 m Patient Age:    75 years       BP:           168/79 mmHg Patient Gender: F              HR:           61 bpm. Exam Location:  [place]  Procedure: 2D Echo, Cardiac Doppler, Color Doppler and Strain Analysis  Indications:    CHF-Acute Diastolic I50.31  History:        Patient has no prior history of Echocardiogram examinations. Risk Factors:Hypertension, Diabetes and Dyslipidemia.  Sonographer:    Lynwood Silvas RDCS Referring Phys: [id number] BRIAN J MUNLEY  IMPRESSIONS   1. GLS -12.4. Left ventricular ejection fraction, by estimation, is 60 to 65%. The left ventricle has normal function. The left ventricle has no regional wall motion abnormalities. There is mild left ventricular hypertrophy. Left ventricular diastolic  parameters are consistent with Grade I diastolic dysfunction (impaired relaxation). 2. Right ventricular systolic function is normal. The right ventricular size is normal. There is normal pulmonary artery systolic pressure. 3. The mitral valve is normal in structure. No evidence of mitral valve regurgitation. No evidence of mitral stenosis. 4. The aortic valve is normal in structure. Aortic valve regurgitation is not visualized. No aortic stenosis is present. 5. The inferior vena cava is normal in size with greater than 50% respiratory variability, suggesting right atrial pressure of 3 mmHg.  FINDINGS Left Ventricle: GLS -12.4. Left ventricular ejection fraction, by estimation, is 60 to 65%. The left ventricle has  normal function. The left ventricle has no regional wall motion abnormalities. The left ventricular internal cavity size was normal in size. There is mild left ventricular hypertrophy. Left ventricular diastolic parameters are consistent with Grade I diastolic dysfunction (impaired relaxation).  Right Ventricle: The right ventricular size is normal. No increase in right ventricular wall thickness. Right ventricular systolic function is normal. There is normal pulmonary artery systolic pressure. The tricuspid regurgitant velocity is 2.11 m/s, and with an assumed right atrial pressure of 3 mmHg, the estimated right ventricular systolic pressure is 20.8 mmHg.  Left [facility]: Left atrial size was normal in size.  Right [facility]: Right atrial size was normal in size.  Pericardium: There is no evidence of pericardial effusion.  Mitral Valve: The mitral valve is normal in structure. No evidence of mitral valve regurgitation. No evidence of mitral valve stenosis.  Tricuspid Valve: The tricuspid valve is normal in structure. Tricuspid valve regurgitation is mild . No evidence of tricuspid stenosis.  Aortic Valve: The aortic valve is normal in structure. Aortic valve regurgitation is not visualized. No aortic stenosis is present.  Pulmonic Valve: The pulmonic valve was normal in structure. Pulmonic valve regurgitation is not visualized. No evidence of pulmonic stenosis.  Aorta: The aortic root is normal in size and structure.  Venous: The inferior vena cava is normal in size with greater than 50% respiratory variability, suggesting right atrial pressure of 3 mmHg.  IAS/Shunts: No atrial level shunt detected by color flow Doppler.   LEFT VENTRICLE PLAX 2D LVIDd:         4.70 cm   Diastology LVIDs:         2.70 cm   LV e' medial:    5.22 cm/s LV PW:         1.20 cm   LV E/e' medial:  11.6 LV IVS:        1.20 cm   LV e' lateral:   5.33 cm/s LVOT diam:     2.00 cm   LV E/e' lateral: 11.4 LV SV:          84 LV SV Index:   42 LVOT Area:     3.14 cm   RIGHT VENTRICLE             IVC RV S prime:     12.60 cm/s  IVC diam: 1.70 cm TAPSE (M-mode): 1.9 cm  LEFT ATRIUM             Index        RIGHT ATRIUM           Index LA diam:        4.40 cm 2.21 cm/m   RA Area:     13.40 cm LA Vol (A2C):   35.7 ml 17.93 ml/m  RA Volume:   27.80 ml  13.96 ml/m LA Vol (  A4C):   47.3 ml 23.76 ml/m LA Biplane Vol: 42.8 ml 21.50 ml/m AORTIC VALVE LVOT Vmax:   109.00 cm/s LVOT Vmean:  76.000 cm/s LVOT VTI:    0.266 m  AORTA Ao Root diam: 3.40 cm Ao Asc diam:  3.20 cm Ao Desc diam: 2.40 cm  MITRAL VALVE               TRICUSPID VALVE MV Area (PHT): 3.19 cm    TR Peak grad:   17.8 mmHg MV Decel Time: 238 msec    TR Vmax:        211.00 cm/s MV E velocity: 60.60 cm/s MV A velocity: 98.50 cm/s  SHUNTS MV E/A ratio:  0.62        Systemic VTI:  0.27 m Systemic Diam: 2.00 cm  Lamar Fitch MD Electronically signed by Lamar Fitch MD Signature Date/Time: 11/01/2021/12:03:39 PM    Final          ______________________________________________________________________________________________      Risk Assessment/Calculations           Physical Exam VS:  BP 120/80   Pulse 76   Ht 5' 3 (1.6 m)   Wt 209 lb (94.8 kg)   SpO2 98%   BMI 37.02 kg/m        Wt Readings from Last 3 Encounters:  02/18/24 209 lb (94.8 kg)  10/17/23 206 lb (93.4 kg)  09/18/23 209 lb (94.8 kg)    GEN: Well nourished, well developed in no acute distress NECK: No JVD; No carotid bruits CARDIAC: RRR, no murmurs, rubs, gallops RESPIRATORY:  Clear to auscultation without rales, wheezing or rhonchi  ABDOMEN: Soft, non-tender, non-distended EXTREMITIES:  No edema; No deformity   ASSESSMENT AND PLAN HFpEF -  NYHA class I-II for dyspnea but not entirely clear if this is d/t heart failure. She is euvolemic. Will check proBNP, echocardiogram.  Continue Lasix  20 mg daily, spironolactone 25 mg  daily.  DOE-will check echocardiogram, could be anginal equivalent however she has known CKD follows closely with nephrologist so I think we should arrange for an ischemic evaluation with a Lexiscan-as she is not able to exercise secondary to bilateral knee pain.  If we cannot can find any contributory causes for DOE from a cardiac perspective, consider sending her to [facility] pulmonology.  CKD III -most recent GFR 41, follows with [facility].  HTN -her blood pressure is well-controlled today at 120/80, currently on spironolactone 25 mg daily     Informed Consent   Shared Decision Making/Informed Consent The risks [chest pain, shortness of breath, cardiac arrhythmias, dizziness, blood pressure fluctuations, myocardial infarction, stroke/transient ischemic attack, nausea, vomiting, allergic reaction, radiation exposure, metallic taste sensation and life-threatening complications (estimated to be 1 in 10,000)], benefits (risk stratification, diagnosing coronary artery disease, treatment guidance) and alternatives of a nuclear stress test were discussed in detail with Ms. Nawaz and she agrees to proceed.     Dispo: Lexiscan, echocardiogram, proBNP and CMET today, follow-up in 3 months.  Signed, Delon JAYSON Hoover, NP

## 2024-02-18 ENCOUNTER — Encounter: Payer: Self-pay | Admitting: Cardiology

## 2024-02-18 ENCOUNTER — Ambulatory Visit: Attending: Cardiology | Admitting: Cardiology

## 2024-02-18 VITALS — BP 120/80 | HR 76 | Ht 63.0 in | Wt 209.0 lb

## 2024-02-18 DIAGNOSIS — R0609 Other forms of dyspnea: Secondary | ICD-10-CM | POA: Diagnosis present

## 2024-02-18 DIAGNOSIS — E785 Hyperlipidemia, unspecified: Secondary | ICD-10-CM | POA: Diagnosis present

## 2024-02-18 DIAGNOSIS — N183 Chronic kidney disease, stage 3 unspecified: Secondary | ICD-10-CM | POA: Diagnosis not present

## 2024-02-18 DIAGNOSIS — I110 Hypertensive heart disease with heart failure: Secondary | ICD-10-CM | POA: Diagnosis present

## 2024-02-18 NOTE — Patient Instructions (Signed)
 Medication Instructions:  Your physician recommends that you continue on your current medications as directed. Please refer to the Current Medication list given to you today.  *If you need a refill on your cardiac medications before your next appointment, please call your pharmacy*  Lab Work: Your physician recommends that you return for lab work in:   Labs today: BMP, Pro BNP  If you have labs (blood work) drawn today and your tests are completely normal, you will receive your results only by: MyChart Message (if you have MyChart) OR A paper copy in the mail If you have any lab test that is abnormal or we need to change your treatment, we will call you to review the results.  Testing/Procedures: Your physician has requested that you have an echocardiogram. Echocardiography is a painless test that uses sound waves to create images of your heart. It provides your doctor with information about the size and shape of your heart and how well your heart's chambers and valves are working. This procedure takes approximately one hour. There are no restrictions for this procedure. Please do NOT wear cologne, perfume, aftershave, or lotions (deodorant is allowed). Please arrive 15 minutes prior to your appointment time.  Please note: We ask at that you not bring children with you during ultrasound (echo/ vascular) testing. Due to room size and safety concerns, children are not allowed in the ultrasound rooms during exams. Our front office staff cannot provide observation of children in our lobby area while testing is being conducted. An adult accompanying a patient to their appointment will only be allowed in the ultrasound room at the discretion of the ultrasound technician under special circumstances. We apologize for any inconvenience.    [facility] [address] [place], [place] [address] Phone:  [phone]    Please arrive 15 minutes prior to your appointment  time for registration and insurance purposes.  The test will take approximately 3 to 4 hours to complete; you may bring reading material.  If someone comes with you to your appointment, they will need to remain in the main lobby due to limited space in the testing area. **If you are pregnant or breastfeeding, please notify the nuclear lab prior to your appointment**  How to prepare for your Myocardial Perfusion Test: Do not eat or drink 3 hours prior to your test, except you may have water. Do not consume products containing caffeine (regular or decaffeinated) 12 hours prior to your test. (ex: coffee, chocolate, sodas, tea). Do wear comfortable clothes (no dresses or overalls) and walking shoes, tennis shoes preferred (No heels or open toe shoes are allowed). Do NOT wear cologne, perfume, aftershave, or lotions (deodorant is allowed). If these instructions are not followed, your test will have to be rescheduled.  Please report to [address] for your test.  If you have questions or concerns about your appointment, you can call the [facility] at [phone].  If you cannot keep your appointment, please provide 24 hours notification to the Nuclear Lab, to avoid a possible $50 charge to your account.   Follow-Up: At [facility], you and your health needs are our priority.  As part of our continuing mission to provide you with exceptional heart care, our providers are all part of one team.  This team includes your primary Cardiologist (physician) and Advanced Practice Providers or APPs (Physician Assistants and Nurse Practitioners) who all work together to provide you with the care  you need, when you need it.  Your next appointment:   3 month(s)  Provider:   Redell Leiter, MD    We recommend signing up for the patient portal called MyChart.  Sign up information is provided on this After Visit Summary.  MyChart is used to connect with patients  for Virtual Visits (Telemedicine).  Patients are able to view lab/test results, encounter notes, upcoming appointments, etc.  Non-urgent messages can be sent to your provider as well.   To learn more about what you can do with MyChart, go to [email or web address].   Other Instructions None

## 2024-02-19 ENCOUNTER — Ambulatory Visit: Payer: Self-pay | Admitting: Cardiology

## 2024-02-19 LAB — BASIC METABOLIC PANEL WITH GFR
BUN/Creatinine Ratio: 13 (ref 12–28)
BUN: 17 mg/dL (ref 8–27)
CO2: 19 mmol/L — ABNORMAL LOW (ref 20–29)
Calcium: 9 mg/dL (ref 8.7–10.3)
Chloride: 103 mmol/L (ref 96–106)
Creatinine, Ser: 1.32 mg/dL — ABNORMAL HIGH (ref 0.57–1.00)
Glucose: 128 mg/dL — ABNORMAL HIGH (ref 70–99)
Potassium: 4.5 mmol/L (ref 3.5–5.2)
Sodium: 141 mmol/L (ref 134–144)
eGFR: 42 mL/min/1.73 — ABNORMAL LOW

## 2024-02-19 LAB — PRO B NATRIURETIC PEPTIDE: NT-Pro BNP: 72 pg/mL (ref 0–738)

## 2024-02-19 NOTE — Telephone Encounter (Signed)
 Patient returned RN's call regarding results.

## 2024-02-27 ENCOUNTER — Telehealth (HOSPITAL_COMMUNITY): Payer: Self-pay | Admitting: *Deleted

## 2024-02-27 NOTE — Telephone Encounter (Signed)
 Spoke to patient as a reminder about about her STRESS TEST on 03/04/24 at 8:00.

## 2024-03-03 ENCOUNTER — Ambulatory Visit: Attending: Cardiology

## 2024-03-03 DIAGNOSIS — E785 Hyperlipidemia, unspecified: Secondary | ICD-10-CM | POA: Insufficient documentation

## 2024-03-03 DIAGNOSIS — R0609 Other forms of dyspnea: Secondary | ICD-10-CM | POA: Insufficient documentation

## 2024-03-03 DIAGNOSIS — I110 Hypertensive heart disease with heart failure: Secondary | ICD-10-CM | POA: Diagnosis present

## 2024-03-03 DIAGNOSIS — N183 Chronic kidney disease, stage 3 unspecified: Secondary | ICD-10-CM | POA: Diagnosis present

## 2024-03-04 ENCOUNTER — Ambulatory Visit: Attending: Cardiology

## 2024-03-04 DIAGNOSIS — N183 Chronic kidney disease, stage 3 unspecified: Secondary | ICD-10-CM | POA: Diagnosis present

## 2024-03-04 DIAGNOSIS — R0609 Other forms of dyspnea: Secondary | ICD-10-CM | POA: Diagnosis not present

## 2024-03-04 DIAGNOSIS — I110 Hypertensive heart disease with heart failure: Secondary | ICD-10-CM | POA: Diagnosis present

## 2024-03-04 DIAGNOSIS — E785 Hyperlipidemia, unspecified: Secondary | ICD-10-CM | POA: Insufficient documentation

## 2024-03-04 LAB — ECHOCARDIOGRAM COMPLETE
AR max vel: 1.63 cm2
AV Area VTI: 1.53 cm2
AV Area mean vel: 1.56 cm2
AV Mean grad: 5.5 mmHg
AV Peak grad: 9.7 mmHg
Ao pk vel: 1.56 m/s
Area-P 1/2: 2.55 cm2
MV VTI: 1.05 cm2
S' Lateral: 2.7 cm

## 2024-03-04 MED ORDER — TECHNETIUM TC 99M TETROFOSMIN IV KIT
10.3000 | PACK | Freq: Once | INTRAVENOUS | Status: AC | PRN
  Administered 2024-03-04: 10.3 via INTRAVENOUS

## 2024-03-04 MED ORDER — TECHNETIUM TC 99M TETROFOSMIN IV KIT
29.5000 | PACK | Freq: Once | INTRAVENOUS | Status: AC | PRN
  Administered 2024-03-04: 29.5 via INTRAVENOUS

## 2024-03-04 MED ORDER — REGADENOSON 0.4 MG/5ML IV SOLN
0.4000 mg | Freq: Once | INTRAVENOUS | Status: AC
  Administered 2024-03-04: 0.4 mg via INTRAVENOUS

## 2024-03-10 LAB — MYOCARDIAL PERFUSION IMAGING
LV dias vol: 68 mL (ref 46–106)
LV sys vol: 20 mL
Nuc Stress EF: 70 %
Peak HR: 75 {beats}/min
Rest HR: 57 {beats}/min
Rest Nuclear Isotope Dose: 10.3 mCi
SDS: 1
SRS: 0
SSS: 1
ST Depression (mm): 0 mm
Stress Nuclear Isotope Dose: 29.5 mCi
TID: 0.95

## 2024-03-12 NOTE — Progress Notes (Signed)
 Patient is in [place] , scheduled for next week/kbl 03/12/24

## 2024-03-19 ENCOUNTER — Telehealth: Payer: Self-pay | Admitting: Cardiology

## 2024-03-19 ENCOUNTER — Ambulatory Visit (HOSPITAL_BASED_OUTPATIENT_CLINIC_OR_DEPARTMENT_OTHER)
Admission: RE | Admit: 2024-03-19 | Discharge: 2024-03-19 | Disposition: A | Discharge status: Home / Self Care | Source: Ambulatory Visit | Attending: Cardiology | Admitting: Cardiology

## 2024-03-19 ENCOUNTER — Encounter: Payer: Self-pay | Admitting: Cardiology

## 2024-03-19 ENCOUNTER — Ambulatory Visit: Attending: Cardiology | Admitting: Cardiology

## 2024-03-19 ENCOUNTER — Ambulatory Visit: Payer: Self-pay | Admitting: Cardiology

## 2024-03-19 VITALS — BP 159/81 | HR 61 | Ht 63.0 in | Wt 211.0 lb

## 2024-03-19 DIAGNOSIS — R0609 Other forms of dyspnea: Secondary | ICD-10-CM | POA: Diagnosis not present

## 2024-03-19 DIAGNOSIS — R931 Abnormal findings on diagnostic imaging of heart and coronary circulation: Secondary | ICD-10-CM | POA: Diagnosis not present

## 2024-03-19 DIAGNOSIS — M7989 Other specified soft tissue disorders: Secondary | ICD-10-CM

## 2024-03-19 DIAGNOSIS — I209 Angina pectoris, unspecified: Secondary | ICD-10-CM

## 2024-03-19 DIAGNOSIS — I110 Hypertensive heart disease with heart failure: Secondary | ICD-10-CM

## 2024-03-19 DIAGNOSIS — E669 Obesity, unspecified: Secondary | ICD-10-CM | POA: Diagnosis present

## 2024-03-19 DIAGNOSIS — E785 Hyperlipidemia, unspecified: Secondary | ICD-10-CM

## 2024-03-19 LAB — D-DIMER, QUANTITATIVE: D-DIMER: 0.44 mg{FEU}/L (ref 0.00–0.49)

## 2024-03-19 MED ORDER — AMLODIPINE BESYLATE 5 MG PO TABS: 5.0000 mg | ORAL_TABLET | Freq: Every day | ORAL | 0 refills | Status: DC

## 2024-03-19 MED ORDER — ASPIRIN 81 MG PO TBEC: 81.0000 mg | DELAYED_RELEASE_TABLET | Freq: Every day | ORAL | Status: DC

## 2024-03-19 MED ORDER — NITROGLYCERIN 0.4 MG SL SUBL: 0.4000 mg | SUBLINGUAL_TABLET | SUBLINGUAL | 6 refills | Status: AC | PRN

## 2024-03-19 NOTE — Telephone Encounter (Signed)
 Calling with crucial lab results. Please advise

## 2024-03-19 NOTE — Patient Instructions (Addendum)
 Medication Instructions:  Your physician has recommended you make the following change in your medication:   Take 81 mg coated aspirin  daily.  Use nitroglycerin  1 tablet placed under the tongue at the first sign of chest pain or an angina attack. 1 tablet may be used every 5 minutes as needed, for up to 15 minutes. Do not take more than 3 tablets in 15 minutes. If pain persist call 911 or go to the nearest ED.  Stop Aldactone  Start Norvasc  5 mg daily. Keep a BP log for the next week  *If you need a refill on your cardiac medications before your next appointment, please call your pharmacy*   Lab Work: Your physician recommends that you have a CMP, d-dimer, lipids and CBC today in the office for your upcoming procedure.  If you have labs (blood work) drawn today and your tests are completely normal, you will receive your results only by: MyChart Message (if you have MyChart) OR A paper copy in the mail If you have any lab test that is abnormal or we need to change your treatment, we will call you to review the results.   Testing/Procedures: Your physician has requested that you have a lower or upper extremity venous duplex. This test is an ultrasound of the veins in the legs or arms. It looks at venous blood flow that carries blood from the heart to the legs or arms. Allow one hour for a Lower Venous exam. Allow thirty minutes for an Upper Venous exam. There are no restrictions or special instructions.  Please note: We ask at that you not bring children with you during ultrasound (echo/ vascular) testing. Due to room size and safety concerns, children are not allowed in the ultrasound rooms during exams. Our front office staff cannot provide observation of children in our lobby area while testing is being conducted. An adult accompanying a patient to their appointment will only be allowed in the ultrasound room at the discretion of the ultrasound technician under special circumstances. We  apologize for any inconvenience.   [facility] A DEPT OF [facility] [place] [address] [place] [address] Dept: [phone] Loc: [phone]  Jyssica Kostelecky  03/19/2024  You are scheduled for a Cardiac Catheterization on Tuesday, August 5 with Dr. Newman Lawrence.  1. Please arrive at the [facility] (Main Entrance A) at [facility]: [address] [place], [place] [address] at 5:30 AM (This time is 2 hour(s) before your procedure to ensure your preparation).   Free valet parking service is available. You will check in at ADMITTING. The support person will be asked to wait in the waiting room.  It is OK to have someone drop you off and come back when you are ready to be discharged.    Special note: Every effort is made to have your procedure done on time. Please understand that emergencies sometimes delay scheduled procedures.  2. Diet: No solid foods after midnight. You may have clear liquids until you leave for the hospital. (Light meal consist of plain toast, fruit, light soups, crackers)  3. Hydration: REGULAR: You need to be well hydrated before your procedure time. You may drink approved liquids (see below) until you leave for the hospital. On the way to the hospital, please drink a 16-oz (1 plastic bottle) of water.      (List of approved liquids water, clear juice, clear tea, black coffee, fruit juices, non-citric and  without pulp, carbonated beverages, Gatorade, Kool -Aid, plain Jello-O and plain ice popsicles)  4. Labs: You had your labs done today.  5. Medication instructions in preparation for your procedure:   Contrast Allergy: No  Stop taking, Lasix  (Furosemide )  Tuesday, August 5,  OzempicWednesday, July 23,  On the morning of your procedure, take your Aspirin  81 mg and any morning medicines NOT listed above.  You may use sips of water.  6. Plan to go home the same day, you will only  stay overnight if medically necessary. 7. Bring a current list of your medications and current insurance cards. 8. You MUST have a responsible person to drive you home. 9. Someone MUST be with you the first 24 hours after you arrive home or your discharge will be delayed. 10. Please wear clothes that are easy to get on and off and wear slip-on shoes.  Thank you for allowing us  to care for you!   -- [facility]    Follow-Up: At [facility], you and your health needs are our priority.  As part of our continuing mission to provide you with exceptional heart care, we have created designated Provider Care Teams.  These Care Teams include your primary Cardiologist (physician) and Advanced Practice Providers (APPs -  Physician Assistants and Nurse Practitioners) who all work together to provide you with the care you need, when you need it.  We recommend signing up for the patient portal called MyChart.  Sign up information is provided on this After Visit Summary.  MyChart is used to connect with patients for Virtual Visits (Telemedicine).  Patients are able to view lab/test results, encounter notes, upcoming appointments, etc.  Non-urgent messages can be sent to your provider as well.   To learn more about what you can do with MyChart, go to [email or web address].    Your next appointment:   4 week(s)  The format for your next appointment:   In Person  Provider:   Jennifer Crape, MD   Other Instructions  Coronary Angiogram With Stent Coronary angiogram with stent placement is a procedure to widen or open a narrow blood vessel of the heart (coronary artery). Arteries may become blocked by cholesterol buildup (plaques) in the lining of the artery wall. When a coronary artery becomes partially blocked, blood flow to that area decreases. This may lead to chest pain or a heart attack (myocardial infarction). A stent is a small piece of metal that looks like  mesh or spring. Stent placement may be done as treatment after a heart attack, or to prevent a heart attack if a blocked artery is found by a coronary angiogram. Let your health care provider know about: Any allergies you have, including allergies to medicines or contrast dye. All medicines you are taking, including vitamins, herbs, eye drops, creams, and over-the-counter medicines. Any problems you or family members have had with anesthetic medicines. Any blood disorders you have. Any surgeries you have had. Any medical conditions you have, including kidney problems or kidney failure. Whether you are pregnant or may be pregnant. Whether you are breastfeeding. What are the risks? Generally, this is a safe procedure. However, serious problems may occur, including: Damage to nearby structures or organs, such as the heart, blood vessels, or kidneys. A return of blockage. Bleeding, infection, or bruising at the insertion site. A collection of blood under the skin (hematoma) at the insertion site. A blood clot in another part of the body. Allergic reaction to medicines or  dyes. Bleeding into the abdomen (retroperitoneal bleeding). Stroke (rare). Heart attack (rare). What happens before the procedure? Staying hydrated Follow instructions from your health care provider about hydration, which may include: Up to 2 hours before the procedure - you may continue to drink clear liquids, such as water, clear fruit juice, black coffee, and plain tea.    Eating and drinking restrictions Follow instructions from your health care provider about eating and drinking, which may include: 8 hours before the procedure - stop eating heavy meals or foods, such as meat, fried foods, or fatty foods. 6 hours before the procedure - stop eating light meals or foods, such as toast or cereal. 2 hours before the procedure - stop drinking clear liquids. Medicines Ask your health care provider about: Changing or  stopping your regular medicines. This is especially important if you are taking diabetes medicines or blood thinners. Taking medicines such as aspirin  and ibuprofen. These medicines can thin your blood. Do not take these medicines unless your health care provider tells you to take them. Generally, aspirin  is recommended before a thin tube, called a catheter, is passed through a blood vessel and inserted into the heart (cardiac catheterization). Taking over-the-counter medicines, vitamins, herbs, and supplements. General instructions Do not use any products that contain nicotine or tobacco for at least 4 weeks before the procedure. These products include cigarettes, e-cigarettes, and chewing tobacco. If you need help quitting, ask your health care provider. Plan to have someone take you home from the hospital or clinic. If you will be going home right after the procedure, plan to have someone with you for 24 hours. You may have tests and imaging procedures. Ask your health care provider: How your insertion site will be marked. Ask which artery will be used for the procedure. What steps will be taken to help prevent infection. These may include: Removing hair at the insertion site. Washing skin with a germ-killing soap. Taking antibiotic medicine. What happens during the procedure? An IV will be inserted into one of your veins. Electrodes may be placed on your chest to monitor your heart rate during the procedure. You will be given one or more of the following: A medicine to help you relax (sedative). A medicine to numb the area (local anesthetic) for catheter insertion. A small incision will be made for catheter insertion. The catheter will be inserted into an artery using a guide wire. The location may be in your groin, your wrist, or the fold of your arm (near your elbow). An X-ray procedure (fluoroscopy) will be used to help guide the catheter to the opening of the heart arteries. A dye  will be injected into the catheter. X-rays will be taken. The dye helps to show where any narrowing or blockages are located in the arteries. Tell your health care provider if you have chest pain or trouble breathing. A tiny wire will be guided to the blocked spot, and a balloon will be inflated to make the artery wider. The stent will be expanded to crush the plaques into the wall of the vessel. The stent will hold the area open and improve the blood flow. Most stents have a drug coating to reduce the risk of the stent narrowing over time. The artery may be made wider using a drill, laser, or other tools that remove plaques. The catheter will be removed when the blood flow improves. The stent will stay where it was placed, and the lining of the artery will grow over it.  A bandage (dressing) will be placed on the insertion site. Pressure will be applied to stop bleeding. The IV will be removed. This procedure may vary among health care providers and hospitals.    What happens after the procedure? Your blood pressure, heart rate, breathing rate, and blood oxygen level will be monitored until you leave the hospital or clinic. If the procedure is done through the leg, you will lie flat in bed for a few hours or for as long as told by your health care provider. You will be instructed not to bend or cross your legs. The insertion site and the pulse in your foot or wrist will be checked often. You may have more blood tests, X-rays, and a test that records the electrical activity of your heart (electrocardiogram, or ECG). Do not drive for 24 hours if you were given a sedative during your procedure. Summary Coronary angiogram with stent placement is a procedure to widen or open a narrowed coronary artery. This is done to treat heart problems. Before the procedure, let your health care provider know about all the medical conditions and surgeries you have or have had. This is a safe procedure. However, some  problems may occur, including damage to nearby structures or organs, bleeding, blood clots, or allergies. Follow your health care provider's instructions about eating, drinking, medicines, and other lifestyle changes, such as quitting tobacco use before the procedure. This information is not intended to replace advice given to you by your health care provider. Make sure you discuss any questions you have with your health care provider. Document Revised: 02/25/2019 Document Reviewed: 02/25/2019 Elsevier Patient Education  2021 Elsevier Inc.  Aspirin  and Your Heart Aspirin  is a medicine that prevents the platelets in your blood from sticking together. Platelets are the cells that your blood uses for clotting. Aspirin  can be used to help reduce the risk of blood clots, heart attacks, and other heart-related problems. What are the risks? Daily use of aspirin  can cause side effects. Some of these include: Bleeding. Bleeding can be minor or serious. An example of minor bleeding is bleeding from a cut, and the bleeding does not stop. An example of more serious bleeding is stomach bleeding or, rarely, bleeding into the brain. Your risk of bleeding increases if you are also taking NSAIDs, such as ibuprofen. Increased bruising. Upset stomach. An allergic reaction. People who have growths inside the nose (nasal polyps) have an increased risk of developing an aspirin  allergy. How to use aspirin  to care for your heart Take aspirin  only as told by your health care provider. Make sure that you understand how much to take and what form to take. The two forms of aspirin  are: Non-enteric-coated.This type of aspirin  does not have a coating and is absorbed quickly. This type of aspirin  also comes in a chewable form. Enteric-coated. This type of aspirin  has a coating that releases the medicine very slowly. Enteric-coated aspirin  might cause less stomach upset than non-enteric-coated aspirin . This type of aspirin  should  not be chewed or crushed. Work with your health care provider to find out whether it is safe and beneficial for you to take aspirin  daily. Taking aspirin  daily may be helpful if: You have had a heart attack or chest pain, or you are at risk for a heart attack. You have a condition in which certain heart vessels are blocked (coronary artery disease), and you have had a procedure to treat it. Examples are: Open-heart surgery, such as coronary artery bypass surgery (  CABG). Coronary angioplasty,which is done to widen a blood vessel of your heart. Having a small mesh tube, or stent, placed in your coronary artery. You have had certain types of stroke or a mini-stroke known as a transient ischemic attack (TIA). You have a narrowing of the arteries that supply the limbs (peripheral artery disease, or PAD). You have long-term (chronic) heart rhythm problems, such as atrial fibrillation, and your health care provider thinks aspirin  may help. You have valve disease or have had surgery on a valve. You are considered at increased risk of developing coronary artery disease or PAD.    Follow these instructions at home Medicines Take over-the-counter and prescription medicines only as told by your health care provider. If you are taking blood thinners: Talk with your health care provider before you take any medicines that contain aspirin  or NSAIDs, such as ibuprofen. These medicines increase your risk for dangerous bleeding. Take your medicine exactly as told, at the same time every day. Avoid activities that could cause injury or bruising, and follow instructions about how to prevent falls. Wear a medical alert bracelet or carry a card that lists what medicines you take. General instructions Do not drink alcohol if: Your health care provider tells you not to drink. You are pregnant, may be pregnant, or are planning to become pregnant. If you drink alcohol: Limit how much you use to: 0-1 drink a day for  women. 0-2 drinks a day for men. Be aware of how much alcohol is in your drink. In the [place] one drink equals one 12 oz bottle of beer (355 mL), one 5 oz glass of wine (148 mL), or one 1 oz glass of hard liquor (44 mL). Keep all follow-up visits as told by your health care provider. This is important. Where to find more information The American Heart Association: www.heart.org Contact a health care provider if you have: Unusual bleeding or bruising. Stomach pain or nausea. Ringing in your ears. An allergic reaction that causes hives, itchy skin, or swelling of the lips, tongue, or face. Get help right away if: You notice that your bowel movements are bloody, or dark red or black in color. You vomit or cough up blood. You have blood in your urine. You cough, breathe loudly (wheeze), or feel short of breath. You have chest pain, especially if the pain spreads to your arms, back, neck, or jaw. You have a headache with confusion. You have any symptoms of a stroke. BE FAST is an easy way to remember the main warning signs of a stroke: B - Balance. Signs are dizziness, sudden trouble walking, or loss of balance. E - Eyes. Signs are trouble seeing or a sudden change in vision. F - Face. Signs are sudden weakness or numbness of the face, or the face or eyelid drooping on one side. A - Arms. Signs are weakness or numbness in an arm. This happens suddenly and usually on one side of the body. S - Speech. Signs are sudden trouble speaking, slurred speech, or trouble understanding what people say. T - Time. Time to call emergency services. Write down what time symptoms started. You have other signs of a stroke, such as: A sudden, severe headache with no known cause. Nausea or vomiting. Seizure. These symptoms may represent a serious problem that is an emergency. Do not wait to see if the symptoms will go away. Get medical help right away. Call your local emergency services (911 in the [place] Do  not drive yourself  to the hospital. Summary Aspirin  use can help reduce the risk of blood clots, heart attacks, and other heart-related problems. Daily use of aspirin  can cause side effects. Take aspirin  only as told by your health care provider. Make sure that you understand how much to take and what form to take. Your health care provider will help you determine whether it is safe and beneficial for you to take aspirin  daily. This information is not intended to replace advice given to you by your health care provider. Make sure you discuss any questions you have with your health care provider. Document Revised: 05/11/2019 Document Reviewed: 05/11/2019 Elsevier Patient Education  2021 Elsevier Inc. Nitroglycerin  sublingual tablets What is this medicine? NITROGLYCERIN  (nye troe GLI ser in) is a type of vasodilator. It relaxes blood vessels, increasing the blood and oxygen supply to your heart. This medicine is used to relieve chest pain caused by angina. It is also used to prevent chest pain before activities like climbing stairs, going outdoors in cold weather, or sexual activity. This medicine may be used for other purposes; ask your health care provider or pharmacist if you have questions. COMMON BRAND NAME(S): Nitroquick, Nitrostat , Nitrotab What should I tell my health care provider before I take this medicine? They need to know if you have any of these conditions: anemia head injury, recent stroke, or bleeding in the brain liver disease previous heart attack an unusual or allergic reaction to nitroglycerin , other medicines, foods, dyes, or preservatives pregnant or trying to get pregnant breast-feeding How should I use this medicine? Take this medicine by mouth as needed. Use at the first sign of an angina attack (chest pain or tightness). You can also take this medicine 5 to 10 minutes before an event likely to produce chest pain. Follow the directions exactly as written on the  prescription label. Place one tablet under your tongue and let it dissolve. Do not swallow whole. Replace the dose if you accidentally swallow it. It will help if your mouth is not dry. Saliva around the tablet will help it to dissolve more quickly. Do not eat or drink, smoke or chew tobacco while a tablet is dissolving. Sit down when taking this medicine. In an angina attack, you should feel better within 5 minutes after your first dose. You can take a dose every 5 minutes up to a total of 3 doses. If you do not feel better or feel worse after 1 dose, call 9-1-1 at once. Do not take more than 3 doses in 15 minutes. Your health care provider might give you other directions. Follow those directions if he or she does. Do not take your medicine more often than directed. Talk to your health care provider about the use of this medicine in children. Special care may be needed. Overdosage: If you think you have taken too much of this medicine contact a poison control center or emergency room at once. NOTE: This medicine is only for you. Do not share this medicine with others. What if I miss a dose? This does not apply. This medicine is only used as needed. What may interact with this medicine? Do not take this medicine with any of the following medications: certain migraine medicines like ergotamine and dihydroergotamine (DHE) medicines used to treat erectile dysfunction like sildenafil, tadalafil, and vardenafil riociguat This medicine may also interact with the following medications: alteplase aspirin  heparin medicines for high blood pressure medicines for mental depression other medicines used to treat angina phenothiazines like chlorpromazine, mesoridazine,  prochlorperazine, thioridazine This list may not describe all possible interactions. Give your health care provider a list of all the medicines, herbs, non-prescription drugs, or dietary supplements you use. Also tell them if you smoke, drink  alcohol, or use illegal drugs. Some items may interact with your medicine. What should I watch for while using this medicine? Tell your doctor or health care professional if you feel your medicine is no longer working. Keep this medicine with you at all times. Sit or lie down when you take your medicine to prevent falling if you feel dizzy or faint after using it. Try to remain calm. This will help you to feel better faster. If you feel dizzy, take several deep breaths and lie down with your feet propped up, or bend forward with your head resting between your knees. You may get drowsy or dizzy. Do not drive, use machinery, or do anything that needs mental alertness until you know how this drug affects you. Do not stand or sit up quickly, especially if you are an older patient. This reduces the risk of dizzy or fainting spells. Alcohol can make you more drowsy and dizzy. Avoid alcoholic drinks. Do not treat yourself for coughs, colds, or pain while you are taking this medicine without asking your doctor or health care professional for advice. Some ingredients may increase your blood pressure. What side effects may I notice from receiving this medicine? Side effects that you should report to your doctor or health care professional as soon as possible: allergic reactions (skin rash, itching or hives; swelling of the face, lips, or tongue) low blood pressure (dizziness; feeling faint or lightheaded, falls; unusually weak or tired) low red blood cell counts (trouble breathing; feeling faint; lightheaded, falls; unusually weak or tired) Side effects that usually do not require medical attention (report to your doctor or health care professional if they continue or are bothersome): facial flushing (redness) headache nausea, vomiting This list may not describe all possible side effects. Call your doctor for medical advice about side effects. You may report side effects to FDA at 1-800-FDA-1088. Where should  I keep my medicine? Keep out of the reach of children. Store at room temperature between 20 and 25 degrees C (68 and 77 degrees F). Store in Retail buyer. Protect from light and moisture. Keep tightly closed. Throw away any unused medicine after the expiration date. NOTE: This sheet is a summary. It may not cover all possible information. If you have questions about this medicine, talk to your doctor, pharmacist, or health care provider.  2021 Elsevier/Gold Standard (2018-05-07 16:46:32)

## 2024-03-19 NOTE — Addendum Note (Signed)
 Addended by: ONEITA BERLINER on: 03/19/2024 09:14 AM   Modules accepted: Orders

## 2024-03-19 NOTE — Progress Notes (Signed)
 Cardiology Office Note:    Date:  03/19/2024   ID:  [name], DOB 21-May-1946, MRN [id number]  PCP:  Clemmie Nest, MD  Cardiologist:  Jennifer JONELLE Crape, MD   Referring MD: Clemmie Nest, MD    ASSESSMENT:    1. Hypertensive heart disease with heart failure (HCC)   2. Abnormal nuclear cardiac imaging test   3. Angina pectoris (HCC)   4. Dyspnea on exertion   5. Obesity (BMI 35.0-39.9 without comorbidity)    PLAN:    In order of problems listed above:  Primary prevention stressed with the patient.  Importance of compliance with diet medication stressed and patient verbalized standing. Dyspnea on exertion: Patient has left leg greater than right this has happened in the past few days.  I will send her to  hospital now for stat DVT study.  She will have Chem-7 and D-dimer there.   Abnormal nuclear stress test: I discussed with her about coronary angiography and left heart catheterization.I discussed coronary angiography and left heart catheterization with the patient at extensive length. Procedure, benefits and potential risks were explained. Patient had multiple questions which were answered to the patient's satisfaction. Patient agreed and consented for the procedure. Further recommendations will be made based on the findings of the coronary angiography. In the interim. The patient has any significant symptoms he knows to go to the nearest emergency room.  In view of renal insufficiency I think it might be just prudent to do coronary angiography and not to perform left ventriculography to see if renal insult.  I will leave this to the decision of my interventional colleague Essential hypertension and renal insufficiency: In view of this I changed her medication from spironolactone to Norvasc  5 mg daily.  She will keep a track of pulse blood pressure.  Hopefully it will help her with her anginal symptoms also.  Sublingual nitroglycerin  prescription was sent, its protocol  and 911 protocol explained and the patient vocalized understanding questions were answered to the patient's satisfaction.  She was advised to take a coated baby aspirin  and a daily basis. Further recommendations will be made based on the findings of the above test.   Medication Adjustments/Labs and Tests Ordered: Current medicines are reviewed at length with the patient today.  Concerns regarding medicines are outlined above.  Orders Placed This Encounter  Procedures   EKG 12-Lead   No orders of the defined types were placed in this encounter.    Chief Complaint  Patient presents with   Follow-up     History of Present Illness:    [name] is a 78 y.o. female.  Patient has past medical history of essential hypertension, mixed dyslipidemia and shortness of breath on exertion.  She is here for abnormal stress test evaluation and advice.  She also mentions to me that in the past couple of days her left leg is greater than the right.  No chest pain.  No sudden shortness of breath.  At the time of my evaluation, the patient is alert awake oriented and in no distress.  Past Medical History:  Diagnosis Date   Adjustment insomnia    ANKLE PAIN, RIGHT 07/24/2007   Qualifier: Diagnosis of  By: Krystal MD, Reyes LABOR    ASTHMA 11/07/2007   Qualifier: Diagnosis of   By: Latisha CMA, Leigh      Replacing diagnoses that were inactivated after the 11/19/22 regulatory import     Barrett's esophagus 08/2011   CHF (congestive heart failure) (HCC)  CHOLELITHIASIS 04/19/2008   Qualifier: Diagnosis of   By: Krystal MD, Reyes A     Replacing diagnoses that were inactivated after the 11/19/22 regulatory import     Chronic kidney disease, stage 3 unspecified (HCC) 09/22/2018   DEPRESSION 03/21/2007   Qualifier: Diagnosis of  By: Krystal RN, Leeroy     DIABETES MELLITUS, TYPE II 12/02/2007   Qualifier: Diagnosis of  By: Krystal MD, Reyes LABOR    Diaphoresis 08/10/2013   Disturbance of skin sensation 10/10/2007    Qualifier: Diagnosis of  By: Krystal MD, Reyes LABOR    Dyslipidemia 03/21/2007   Qualifier: Diagnosis of   By: Krystal OBIE Leeroy CARMIN 12/10/2008   Qualifier: Diagnosis of  By: Aneita MD NOLIA Gist T    ESOPHAGEAL STRICTURE 12/09/2008   Qualifier: Diagnosis of  By: Lewellyn CMA (AAMA), Amanda     Fibromyalgia    GASTRITIS 11/25/2007   Qualifier: Diagnosis of   By: Rosabel Penny Balm, Norchel      Replacing diagnoses that were inactivated after the 11/19/22 regulatory import     GASTROESOPHAGEAL REFLUX DISEASE 11/25/2007   Qualifier: Diagnosis of  By: Rosabel Penny Balm, Norchel     GOUT 12/02/2007   Qualifier: Diagnosis of  By: Krystal MD, Reyes LABOR    HEMORRHOIDS 12/09/2008   Qualifier: Diagnosis of   By: Lewellyn CMA (AAMA), Amanda      Replacing diagnoses that were inactivated after the 11/19/22 regulatory import     History of anaphylaxis 09/22/2018   History of colonic polyps 12/09/2008   Qualifier: Diagnosis of   By: Lewellyn CMA (AAMA), Amanda      IMO SNOMED Dx Update Oct 2024     Hyperlipidemia 09/22/2018   HYPERTENSION 03/21/2007   Qualifier: Diagnosis of   By: Krystal, RN, Leeroy      Replacing diagnoses that were inactivated after the 11/19/22 regulatory import     LUNG NODULE 11/10/2007   Qualifier: Diagnosis of  By: Neysa MD, Clinton D    OBESITY NOS 03/21/2007   Qualifier: Diagnosis of   By: Krystal RN, Leeroy         OBSTRUCTIVE SLEEP APNEA 11/07/2007   Qualifier: Diagnosis of  By: Latisha CMA, Leigh     Ovarian cancer (HCC) 2009   Sleep pattern disturbance 01/11/2020   UNSPECIFIED MYALGIA AND MYOSITIS 11/25/2007   Qualifier: Diagnosis of  By: Krystal MD, Reyes LABOR     Past Surgical History:  Procedure Laterality Date   CESAREAN SECTION     X2   CHOLECYSTECTOMY     REPLACEMENT TOTAL KNEE  07/2005, 10/2010   bilateral   TONSILLECTOMY     TRACHEOSTOMY     VAGINAL HYSTERECTOMY  08/21/2007   for ovarian tumor    Current Medications: Current Meds  Medication Sig    albuterol  (VENTOLIN  HFA) 108 (90 Base) MCG/ACT inhaler Inhale 2 puffs into the lungs every 4 (four) hours as needed for wheezing or shortness of breath.   allopurinol (ZYLOPRIM) 300 MG tablet Take 300 mg by mouth daily.     benzonatate  (TESSALON ) 100 MG capsule Take 1 capsule (100 mg total) by mouth 3 (three) times daily as needed for cough.   EPINEPHrine 0.3 mg/0.3 mL IJ SOAJ injection Inject 0.3 mg into the skin once as needed for allergies.   furosemide  (LASIX ) 20 MG tablet Take 1 tablet (20 mg total) by mouth daily.   Magnesium 400 MG TABS Take 400 mg  by mouth daily.   Multiple Vitamins-Minerals (CENTRUM WOMEN PO) Take 1 tablet by mouth every morning.   pantoprazole  (PROTONIX ) 40 MG tablet Take 1 tablet (40 mg total) by mouth 2 (two) times daily.   Semaglutide,0.25 or 0.5MG /DOS, (OZEMPIC, 0.25 OR 0.5 MG/DOSE,) 2 MG/1.5ML SOPN Inject 0.5 mg into the skin once a week.   spironolactone (ALDACTONE) 25 MG tablet Take 25 mg by mouth daily.     Allergies:   Aspirin , Codeine, Dapagliflozin, Erythromycin ethylsuccinate, Latex, and Penicillins   Social History   Socioeconomic History   Marital status: Married    Spouse name: Not on file   Number of children: 2   Years of education: Not on file   Highest education level: Not on file  Occupational History   Occupation: RN  Tobacco Use   Smoking status: Never   Smokeless tobacco: Never  Substance and Sexual Activity   Alcohol use: No   Drug use: No   Sexual activity: Not on file  Other Topics Concern   Not on file  Social History Narrative   Not on file   Social Drivers of Health   Financial Resource Strain: Not on file  Food Insecurity: Not on file  Transportation Needs: Not on file  Physical Activity: Not on file  Stress: Not on file  Social Connections: Not on file     Family History: The patient's family history includes Breast cancer in her mother; Coronary artery disease in her father; Diabetes in her father; Heart attack  in her brother and father; Multiple myeloma in her sister; Ovarian cancer in her mother.  ROS:   Please see the history of present illness.    All other systems reviewed and are negative.  EKGs/Labs/Other Studies Reviewed:    The following studies were reviewed today: .SABRAEKG Interpretation Date/Time:  Thursday March 19 2024 07:55:54 EDT Ventricular Rate:  61 PR Interval:  202 QRS Duration:  88 QT Interval:  440 QTC Calculation: 442 R Axis:   48  Text Interpretation: Normal sinus rhythm with sinus arrhythmia Normal ECG When compared with ECG of 17-Oct-2023 00:50, PREVIOUS ECG IS PRESENT Confirmed by Edwyna Backers [phone]) on 03/19/2024 8:20:06 AM     Recent Labs: 08/19/2023: ALT 18; B Natriuretic Peptide 32.4 10/17/2023: Hemoglobin 14.0; Platelets 234 02/18/2024: BUN 17; Creatinine, Ser 1.32; NT-Pro BNP 72; Potassium 4.5; Sodium 141  Recent Lipid Panel    Component Value Date/Time   CHOL 176 09/17/2022 1130   TRIG 180 (H) 09/17/2022 1130   HDL 37 (L) 09/17/2022 1130   CHOLHDL 4.8 (H) 09/17/2022 1130   CHOLHDL 7.4 12/24/2007 0920   VLDL 21 12/24/2007 0920   LDLCALC 107 (H) 09/17/2022 1130   LDLDIRECT 165.5 06/24/2007 1545    Physical Exam:    VS:  BP (!) 159/81   Pulse 61   Ht 5' 3 (1.6 m)   Wt 211 lb (95.7 kg)   SpO2 97%   BMI 37.38 kg/m     Wt Readings from Last 3 Encounters:  03/19/24 211 lb (95.7 kg)  03/04/24 209 lb (94.8 kg)  02/18/24 209 lb (94.8 kg)     GEN: Patient is in no acute distress HEENT: Normal NECK: No JVD; No carotid bruits LYMPHATICS: No lymphadenopathy CARDIAC: Hear sounds regular, 2/6 systolic murmur at the apex. RESPIRATORY:  Clear to auscultation without rales, wheezing or rhonchi  ABDOMEN: Soft, non-tender, non-distended MUSCULOSKELETAL:  No edema; No deformity  SKIN: Warm and dry NEUROLOGIC:  Alert and oriented x  3 PSYCHIATRIC:  Normal affect   Signed, Jennifer JONELLE Crape, MD  03/19/2024 8:36 AM    [facility]

## 2024-03-19 NOTE — Telephone Encounter (Signed)
 D-dimer 0.44 normal is less than 0.50. Pt has been made aware of normal lab.

## 2024-03-19 NOTE — Addendum Note (Signed)
 Addended by: Alantis Bethune, ANNABELLA L on: 03/19/2024 09:29 AM   Modules accepted: Orders

## 2024-03-20 ENCOUNTER — Other Ambulatory Visit: Payer: Self-pay

## 2024-03-20 MED ORDER — AMLODIPINE BESYLATE 5 MG PO TABS: 5.0000 mg | ORAL_TABLET | Freq: Every day | ORAL | 3 refills | Status: DC

## 2024-03-21 LAB — CBC WITH DIFFERENTIAL/PLATELET
Basophils Absolute: 0.1 x10E3/uL (ref 0.0–0.2)
Basos: 1 %
EOS (ABSOLUTE): 0.2 x10E3/uL (ref 0.0–0.4)
Eos: 3 %
Hematocrit: 41.1 % (ref 34.0–46.6)
Hemoglobin: 13.4 g/dL (ref 11.1–15.9)
Immature Grans (Abs): 0 x10E3/uL (ref 0.0–0.1)
Immature Granulocytes: 0 %
Lymphocytes Absolute: 2 x10E3/uL (ref 0.7–3.1)
Lymphs: 31 %
MCH: 29.1 pg (ref 26.6–33.0)
MCHC: 32.6 g/dL (ref 31.5–35.7)
MCV: 89 fL (ref 79–97)
Monocytes Absolute: 0.5 x10E3/uL (ref 0.1–0.9)
Monocytes: 8 %
Neutrophils Absolute: 3.7 x10E3/uL (ref 1.4–7.0)
Neutrophils: 56 %
Platelets: 233 x10E3/uL (ref 150–450)
RBC: 4.6 x10E6/uL (ref 3.77–5.28)
RDW: 13.5 % (ref 11.7–15.4)
WBC: 6.5 x10E3/uL (ref 3.4–10.8)

## 2024-03-21 LAB — COMPREHENSIVE METABOLIC PANEL WITH GFR
ALT: 14 IU/L (ref 0–32)
AST: 19 IU/L (ref 0–40)
Albumin: 4.1 g/dL (ref 3.8–4.8)
Alkaline Phosphatase: 84 IU/L (ref 44–121)
BUN/Creatinine Ratio: 18 (ref 12–28)
BUN: 25 mg/dL (ref 8–27)
Bilirubin Total: 0.5 mg/dL (ref 0.0–1.2)
CO2: 21 mmol/L (ref 20–29)
Calcium: 8.6 mg/dL — ABNORMAL LOW (ref 8.7–10.3)
Chloride: 105 mmol/L (ref 96–106)
Creatinine, Ser: 1.39 mg/dL — ABNORMAL HIGH (ref 0.57–1.00)
Globulin, Total: 1.9 g/dL (ref 1.5–4.5)
Glucose: 124 mg/dL — ABNORMAL HIGH (ref 70–99)
Potassium: 4.8 mmol/L (ref 3.5–5.2)
Sodium: 145 mmol/L — ABNORMAL HIGH (ref 134–144)
Total Protein: 6 g/dL (ref 6.0–8.5)
eGFR: 39 mL/min/1.73 — ABNORMAL LOW (ref >59)

## 2024-03-21 LAB — LIPID PANEL
Chol/HDL Ratio: 6 ratio — ABNORMAL HIGH (ref 0.0–4.4)
Cholesterol, Total: 186 mg/dL (ref 100–199)
HDL: 31 mg/dL — ABNORMAL LOW (ref >39)
LDL Chol Calc (NIH): 120 mg/dL — ABNORMAL HIGH (ref 0–99)
Triglycerides: 197 mg/dL — ABNORMAL HIGH (ref 0–149)
VLDL Cholesterol Cal: 35 mg/dL (ref 5–40)

## 2024-03-23 ENCOUNTER — Telehealth: Payer: Self-pay | Admitting: *Deleted

## 2024-03-23 NOTE — Telephone Encounter (Addendum)
 Cardiac Catheterization scheduled at [facility] for: Tuesday March 24, 2024 12 Noon Arrival time [facility] Main Entrance A at: 7 AM-per protocol GFR < 45 (39)  Diet: -May have light meal until 6 AM. (6 hours before procedure time) Approved light meal consists of plain toast, fruit, light soups, crackers.  Hydration: -May drink clear liquids until leaving for hospital. Approved liquids: Water , clear juice, clear tea, black coffee, fruit juices-non-citric and without pulp,Gatorade, plain Jello/popsicles Going in early for hydration-does not need to drink water  OTW to hospital per cath lab RN.  Medication instructions: -Hold:  Lasix -day before and day of procedure-per protocol GFR < 60 (39)  Pt tells me Ozempic weekly on Tuesdays-will hold 8/5 (Tuesday) -Other usual morning medications can be taken including aspirin  81 mg-pt tells me she can tolerate aspirin  81 mg.  Plan to go home the same day, you will only stay overnight if medically necessary.  You must have responsible adult to drive you home.  Someone must be with you the first 24 hours after you arrive home.  Reviewed procedure instructions, pre-procedure hydration/time change.

## 2024-03-24 ENCOUNTER — Encounter (HOSPITAL_COMMUNITY)
Admission: RE | Disposition: A | Discharge status: Home / Self Care | Payer: Self-pay | Source: Home / Self Care | Attending: Cardiology

## 2024-03-24 ENCOUNTER — Ambulatory Visit (HOSPITAL_COMMUNITY): Admission: RE | Admit: 2024-03-24 | Source: Home / Self Care | Admitting: Cardiology

## 2024-03-24 ENCOUNTER — Encounter (HOSPITAL_COMMUNITY): Payer: Self-pay | Admitting: Cardiology

## 2024-03-24 ENCOUNTER — Other Ambulatory Visit: Payer: Self-pay

## 2024-03-24 DIAGNOSIS — I130 Hypertensive heart and chronic kidney disease with heart failure and stage 1 through stage 4 chronic kidney disease, or unspecified chronic kidney disease: Secondary | ICD-10-CM | POA: Diagnosis not present

## 2024-03-24 DIAGNOSIS — N183 Chronic kidney disease, stage 3 unspecified: Secondary | ICD-10-CM | POA: Diagnosis not present

## 2024-03-24 DIAGNOSIS — E782 Mixed hyperlipidemia: Secondary | ICD-10-CM | POA: Insufficient documentation

## 2024-03-24 DIAGNOSIS — Z79899 Other long term (current) drug therapy: Secondary | ICD-10-CM | POA: Diagnosis not present

## 2024-03-24 DIAGNOSIS — R0609 Other forms of dyspnea: Secondary | ICD-10-CM | POA: Diagnosis not present

## 2024-03-24 DIAGNOSIS — Z7985 Long-term (current) use of injectable non-insulin antidiabetic drugs: Secondary | ICD-10-CM | POA: Diagnosis not present

## 2024-03-24 DIAGNOSIS — R9439 Abnormal result of other cardiovascular function study: Secondary | ICD-10-CM | POA: Diagnosis not present

## 2024-03-24 DIAGNOSIS — Z006 Encounter for examination for normal comparison and control in clinical research program: Secondary | ICD-10-CM

## 2024-03-24 HISTORY — PX: LEFT HEART CATH AND CORONARY ANGIOGRAPHY: CATH118249

## 2024-03-24 LAB — GLUCOSE, CAPILLARY
Glucose-Capillary: 115 mg/dL — ABNORMAL HIGH (ref 70–99)
Glucose-Capillary: 113 mg/dL — ABNORMAL HIGH (ref 70–99)

## 2024-03-24 SURGERY — LEFT HEART CATH AND CORONARY ANGIOGRAPHY
Anesthesia: LOCAL

## 2024-03-24 MED ORDER — SODIUM CHLORIDE 0.9% FLUSH
3.0000 mL | INTRAVENOUS | Status: DC | PRN
Start: 2024-03-24 — End: 2024-03-24

## 2024-03-24 MED ORDER — MIDAZOLAM HCL 2 MG/2ML IJ SOLN
INTRAMUSCULAR | Status: AC
  Filled 2024-03-24: qty 2

## 2024-03-24 MED ORDER — LIDOCAINE HCL (PF) 1 % IJ SOLN
INTRAMUSCULAR | Status: AC
  Filled 2024-03-24: qty 30

## 2024-03-24 MED ORDER — HEPARIN SODIUM (PORCINE) 1000 UNIT/ML IJ SOLN
INTRAMUSCULAR | Status: AC
  Filled 2024-03-24: qty 10

## 2024-03-24 MED ORDER — ACETAMINOPHEN 325 MG PO TABS
650.0000 mg | ORAL_TABLET | Freq: Once | ORAL | Status: AC
  Administered 2024-03-24: 650 mg via ORAL

## 2024-03-24 MED ORDER — HEPARIN SODIUM (PORCINE) 1000 UNIT/ML IJ SOLN
INTRAMUSCULAR | Status: DC | PRN
  Administered 2024-03-24: 5000 [IU] via INTRAVENOUS

## 2024-03-24 MED ORDER — VERAPAMIL HCL 2.5 MG/ML IV SOLN
INTRAVENOUS | Status: DC | PRN
  Administered 2024-03-24: 10 mL via INTRA_ARTERIAL

## 2024-03-24 MED ORDER — MIDAZOLAM HCL 2 MG/2ML IJ SOLN
INTRAMUSCULAR | Status: DC | PRN
Start: 2024-03-24 — End: 2024-03-24
  Administered 2024-03-24: 1 mg via INTRAVENOUS

## 2024-03-24 MED ORDER — VERAPAMIL HCL 2.5 MG/ML IV SOLN
INTRAVENOUS | Status: AC
  Filled 2024-03-24: qty 2

## 2024-03-24 MED ORDER — SODIUM CHLORIDE 0.9 % WEIGHT BASED INFUSION: 1.0000 mL/kg/h | INTRAVENOUS | Status: DC

## 2024-03-24 MED ORDER — SODIUM CHLORIDE 0.9% FLUSH: 3.0000 mL | Freq: Two times a day (BID) | INTRAVENOUS | Status: DC

## 2024-03-24 MED ORDER — IOHEXOL 350 MG/ML SOLN
INTRAVENOUS | Status: DC | PRN
  Administered 2024-03-24: 25 mL

## 2024-03-24 MED ORDER — HYDRALAZINE HCL 20 MG/ML IJ SOLN: 10.0000 mg | INTRAMUSCULAR | Status: DC | PRN

## 2024-03-24 MED ORDER — ROSUVASTATIN CALCIUM 10 MG PO TABS: 10.0000 mg | ORAL_TABLET | Freq: Every day | ORAL | 3 refills | Status: DC

## 2024-03-24 MED ORDER — FENTANYL CITRATE (PF) 100 MCG/2ML IJ SOLN
INTRAMUSCULAR | Status: AC
  Filled 2024-03-24: qty 2

## 2024-03-24 MED ORDER — ACETAMINOPHEN 325 MG PO TABS: 650.0000 mg | ORAL_TABLET | ORAL | Status: DC | PRN

## 2024-03-24 MED ORDER — SODIUM CHLORIDE 0.9 % WEIGHT BASED INFUSION
3.0000 mL/kg/h | INTRAVENOUS | Status: AC
  Administered 2024-03-24: 3 mL/kg/h via INTRAVENOUS

## 2024-03-24 MED ORDER — ASPIRIN 81 MG PO CHEW: 81.0000 mg | CHEWABLE_TABLET | ORAL | Status: AC

## 2024-03-24 MED ORDER — SODIUM CHLORIDE 0.9 % IV SOLN: 250.0000 mL | INTRAVENOUS | Status: DC | PRN

## 2024-03-24 MED ORDER — FREE WATER: 250.0000 mL | Freq: Once | Status: DC

## 2024-03-24 MED ORDER — FENTANYL CITRATE (PF) 100 MCG/2ML IJ SOLN
INTRAMUSCULAR | Status: DC | PRN
  Administered 2024-03-24: 25 ug via INTRAVENOUS

## 2024-03-24 MED ORDER — HEPARIN (PORCINE) IN NACL 1000-0.9 UT/500ML-% IV SOLN
INTRAVENOUS | Status: DC | PRN
  Administered 2024-03-24 (×2): 500 mL

## 2024-03-24 MED ORDER — ONDANSETRON HCL 4 MG/2ML IJ SOLN: 4.0000 mg | Freq: Four times a day (QID) | INTRAMUSCULAR | Status: DC | PRN

## 2024-03-24 MED ORDER — LIDOCAINE HCL (PF) 1 % IJ SOLN
INTRAMUSCULAR | Status: DC | PRN
Start: 2024-03-24 — End: 2024-03-24
  Administered 2024-03-24: 5 mL

## 2024-03-24 MED ORDER — LABETALOL HCL 5 MG/ML IV SOLN: 10.0000 mg | INTRAVENOUS | Status: DC | PRN

## 2024-03-24 SURGICAL SUPPLY — 6 items
CATH INFINITI AMBI 5FR TG (CATHETERS) IMPLANT
DEVICE RAD COMP TR BAND LRG (VASCULAR PRODUCTS) IMPLANT
GLIDESHEATH SLEND A-KIT 6F 22G (SHEATH) IMPLANT
GUIDEWIRE INQWIRE 1.5J.035X260 (WIRE) IMPLANT
PACK CARDIAC CATHETERIZATION (CUSTOM PROCEDURE TRAY) ×1 IMPLANT
SET ATX-X65L (MISCELLANEOUS) IMPLANT

## 2024-03-24 NOTE — Interval H&P Note (Signed)
 History and Physical Interval Note:  03/24/2024 12:58 PM  [name]  has presented today for surgery, with the diagnosis of abnormal stresss test.  The various methods of treatment have been discussed with the patient and family. After consideration of risks, benefits and other options for treatment, the patient has consented to  Procedure(s): LEFT HEART CATH AND CORONARY ANGIOGRAPHY (N/A) as a surgical intervention.  The patient's history has been reviewed, patient examined, no change in status, stable for surgery.  I have reviewed the patient's chart and labs.  Questions were answered to the patient's satisfaction.     [name] [name]

## 2024-03-24 NOTE — Discharge Instructions (Addendum)
 Radial Site Care The following information offers guidance on how to care for yourself after your procedure. Your health care provider may also give you more specific instructions. If you have problems or questions, contact your health care provider. What can I expect after the procedure? After the procedure, it is common to have bruising and tenderness in the incision area. Follow these instructions at home: Incision site care  Follow instructions from your health care provider about how to take care of your incision site. Make sure you: Wash your hands with soap and water for at least 20 seconds before and after you change your bandage (dressing). If soap and water are not available, use hand sanitizer. Remove your dressing in 24 hours. Leave stitches (sutures), skin glue, or adhesive strips in place. These skin closures may need to stay in place for 2 weeks or longer. If adhesive strip edges start to loosen and curl up, you may trim the loose edges. Do not remove adhesive strips completely unless your health care provider tells you to do that. Do not take baths, swim, or use a hot tub for at least 1 week. You may shower 24 hours after the procedure or as told by your health care provider. Remove the dressing and gently wash the incision area with plain soap and water. Pat the area dry with a clean towel. Do not rub the site. That could cause bleeding. Do not apply powder or lotion to the site. Check your incision site every day for signs of infection. Check for: Redness, swelling, or pain. Fluid or blood. Warmth. Pus or a bad smell. Activity For 24 hours after the procedure, or as directed by your health care provider: Do not flex or bend the affected arm. Do not push or pull heavy objects with the affected arm. Do not operate machinery or power tools. Do not drive. You should not drive yourself home from the hospital or clinic if you go home during that time period. You may drive 24  hours after the procedure unless your health care provider tells you not to. Do not lift anything that is heavier than 10 lb (4.5 kg), or the limit that you are told, until your health care provider says that it is safe. Return to your normal activities as told by your health care provider. Ask your health care provider what activities are safe for you and when you can return to work. If you were given a sedative during the procedure, it can affect you for several hours. Do not drive or operate machinery until your health care provider says that it is safe. General instructions Take over-the-counter and prescription medicines only as told by your health care provider. If you will be going home right after the procedure, plan to have a responsible adult care for you for the time you are told. This is important. Keep all follow-up visits. This is important. Contact a health care provider if: You have a fever or chills. You have any of these signs of infection at your incision site: Redness, swelling, or pain. Fluid or blood. Warmth. Pus or a bad smell. Get help right away if: The incision area swells very fast. The incision area is bleeding, and the bleeding does not stop when you hold steady pressure on the area. Your arm or hand becomes pale, cool, tingly, or numb. These symptoms may represent a serious problem that is an emergency. Do not wait to see if the symptoms will go away. Get medical  help right away. Call your local emergency services (911 in the [place] Do not drive yourself to the hospital. Summary After the procedure, it is common to have bruising and tenderness at the incision site. Follow instructions from your health care provider about how to take care of your radial site incision. Check the incision every day for signs of infection. Do not lift anything that is heavier than 10 lb (4.5 kg), or the limit that you are told, until your health care provider says that it is  safe. Get help right away if the incision area swells very fast, you have bleeding at the incision site that will not stop, or your arm or hand becomes pale, cool, or numb. This information is not intended to replace advice given to you by your health care provider. Make sure you discuss any questions you have with your health care provider. Document Revised: 09/25/2020 Document Reviewed: 09/25/2020 Elsevier Patient Education  2024 [employer].

## 2024-03-24 NOTE — Telephone Encounter (Signed)
 MyChart message

## 2024-03-24 NOTE — Interval H&P Note (Signed)
 History and Physical Interval Note:  03/24/2024 8:43 AM  [name]  has presented today for surgery, with the diagnosis of abnormal stresss test.  The various methods of treatment have been discussed with the patient and family. After consideration of risks, benefits and other options for treatment, the patient has consented to  Procedure(s): LEFT HEART CATH AND CORONARY ANGIOGRAPHY (N/A) as a surgical intervention.  The patient's history has been reviewed, patient examined, no change in status, stable for surgery.  I have reviewed the patient's chart and labs.  Questions were answered to the patient's satisfaction.     Fayette Gasner J Breaunna Gottlieb

## 2024-03-24 NOTE — Research (Cosign Needed Addendum)
 Prevail  Informed Consent   Subject Name: [name]  Subject met inclusion and exclusion criteria.  The informed consent form, study requirements and expectations were reviewed with the subject and questions and concerns were addressed prior to the signing of the consent form.  The subject verbalized understanding of the trial requirements.  The subject agreed to participate in the Prevail trial and signed the informed consent at 0745 on 03/24/2024.  The informed consent was obtained prior to performance of any protocol-specific procedures for the subject.  A copy of the signed informed consent was given to the subject and a copy was placed in the subject's medical record.   Uma Jerde    Screen Fail

## 2024-03-24 NOTE — H&P (Signed)
 OV 03/19/2024 copied for documentation   Cardiology Office Note:    Date:  03/24/2024   ID:  Rheanna Sergent, DOB 21-Oct-1945, MRN [id number]  PCP:  Clemmie Nest, MD  Cardiologist:  Newman JINNY Lawrence, MD   Referring MD: No ref. provider found    ASSESSMENT:    CAD:  PLAN:    In order of problems listed above:  Primary prevention stressed with the patient.  Importance of compliance with diet medication stressed and patient verbalized standing. Dyspnea on exertion: Patient has left leg greater than right this has happened in the past few days.  I will send her to [facility] [facility] now for stat DVT study.  She will have Chem-7 and D-dimer there.   Abnormal nuclear stress test: I discussed with her about coronary angiography and left heart catheterization.I discussed coronary angiography and left heart catheterization with the patient at extensive length. Procedure, benefits and potential risks were explained. Patient had multiple questions which were answered to the patient's satisfaction. Patient agreed and consented for the procedure. Further recommendations will be made based on the findings of the coronary angiography. In the interim. The patient has any significant symptoms he knows to go to the nearest emergency room.  In view of renal insufficiency I think it might be just prudent to do coronary angiography and not to perform left ventriculography to see if renal insult.  I will leave this to the decision of my interventional colleague Essential hypertension and renal insufficiency: In view of this I changed her medication from spironolactone to Norvasc  5 mg daily.  She will keep a track of pulse blood pressure.  Hopefully it will help her with her anginal symptoms also.  Sublingual nitroglycerin  prescription was sent, its protocol and 911 protocol explained and the patient vocalized understanding questions were answered to the patient's satisfaction.  She was advised to take a coated  baby aspirin  and a daily basis. Further recommendations will be made based on the findings of the above test.   Medication Adjustments/Labs and Tests Ordered: Current medicines are reviewed at length with the patient today.  Concerns regarding medicines are outlined above.  Orders Placed This Encounter  Procedures   Glucose, capillary   Informed Consent Details: Physician/Practitioner Attestation; Transcribe to consent form and obtain patient signature   Apply Cardiac or Vascular Catheterization and/or Intervention Care Plan   Confirm CBC and BMP (or CMP) results within 7 days for inpatient and 30 days for outpatient: Outpatients with severe anemia (hgb<10, CKD, severe thrombocytopenia plts<100) labs should be within 10 days. Only draw PT/INR on patients that are on Coumadin, Hgb<10, have liver disease (cirrhosis, liver CA, hepatitis, etc). Urine pregnancy test within hospital admission for inpatients of child bearing age, for outpatients day of procedure.   Confirm EKG performed within 30 days for cardiac procedures and 12 months for peripheral vascular procedures.  Place order for EKG if missing or not within timeframe.   Verify aspirin  and / or anti-platelet medication (Plavix, Effient, Brilinta) dose available for cardiac / peripheral vascular procedure day. IF ordered daily / once, adjust schedule to administer before procedure.   Weigh patient   Initiate Cath/PCI clinical path; encourage patient to watch CCTV video   Insert peripheral IV   Insert 2nd peripheral IV site-Saline lock IV   Meds ordered this encounter  Medications   aspirin  chewable tablet 81 mg   FOLLOWED BY Linked Order Group    0.9% sodium chloride  infusion    0.9% sodium chloride  infusion  No chief complaint on file.    History of Present Illness:    Marlisha Culmer is a 78 y.o. female.  Patient has past medical history of essential hypertension, mixed dyslipidemia and shortness of breath on exertion.  She is  here for abnormal stress test evaluation and advice.  She also mentions to me that in the past couple of days her left leg is greater than the right.  No chest pain.  No sudden shortness of breath.  At the time of my evaluation, the patient is alert awake oriented and in no distress.  Past Medical History:  Diagnosis Date   Adjustment insomnia    ANKLE PAIN, RIGHT 07/24/2007   Qualifier: Diagnosis of  By: Krystal MD, Reyes LABOR    ASTHMA 11/07/2007   Qualifier: Diagnosis of   By: Latisha CMA, Leigh      Replacing diagnoses that were inactivated after the 11/19/22 regulatory import     Barrett's esophagus 08/2011   CHF (congestive heart failure) (HCC)    CHOLELITHIASIS 04/19/2008   Qualifier: Diagnosis of   By: Krystal MD, Reyes A     Replacing diagnoses that were inactivated after the 11/19/22 regulatory import     Chronic kidney disease, stage 3 unspecified (HCC) 09/22/2018   DEPRESSION 03/21/2007   Qualifier: Diagnosis of  By: Krystal RN, Leeroy     DIABETES MELLITUS, TYPE II 12/02/2007   Qualifier: Diagnosis of  By: Krystal MD, Reyes LABOR    Diaphoresis 08/10/2013   Disturbance of skin sensation 10/10/2007   Qualifier: Diagnosis of  By: Krystal MD, Reyes LABOR    Dyslipidemia 03/21/2007   Qualifier: Diagnosis of   By: Krystal OBIE Leeroy CARMIN 12/10/2008   Qualifier: Diagnosis of  By: Aneita MD NOLIA Gist T    ESOPHAGEAL STRICTURE 12/09/2008   Qualifier: Diagnosis of  By: Earlean CMA (AAMA), Amanda     Fibromyalgia    GASTRITIS 11/25/2007   Qualifier: Diagnosis of   By: Rosabel Penny Balm, Norchel      Replacing diagnoses that were inactivated after the 11/19/22 regulatory import     GASTROESOPHAGEAL REFLUX DISEASE 11/25/2007   Qualifier: Diagnosis of  By: Rosabel Penny Balm, Norchel     GOUT 12/02/2007   Qualifier: Diagnosis of  By: Krystal MD, Reyes LABOR    HEMORRHOIDS 12/09/2008   Qualifier: Diagnosis of   By: Lewellyn CMA (AAMA), Amanda      Replacing diagnoses that were inactivated after  the 11/19/22 regulatory import     History of anaphylaxis 09/22/2018   History of colonic polyps 12/09/2008   Qualifier: Diagnosis of   By: Lewellyn CMA (AAMA), Amanda      IMO SNOMED Dx Update Oct 2024     Hyperlipidemia 09/22/2018   HYPERTENSION 03/21/2007   Qualifier: Diagnosis of   By: Krystal, RN, Leeroy      Replacing diagnoses that were inactivated after the 11/19/22 regulatory import     LUNG NODULE 11/10/2007   Qualifier: Diagnosis of  By: Neysa MD, Clinton D    OBESITY NOS 03/21/2007   Qualifier: Diagnosis of   By: Krystal OBIE Leeroy         OBSTRUCTIVE SLEEP APNEA 11/07/2007   Qualifier: Diagnosis of  By: Latisha CMA, Leigh     Ovarian cancer (HCC) 2009   Sleep pattern disturbance 01/11/2020   UNSPECIFIED MYALGIA AND MYOSITIS 11/25/2007   Qualifier: Diagnosis of  By: Krystal MD, Reyes LABOR  Past Surgical History:  Procedure Laterality Date   CESAREAN SECTION     X2   CHOLECYSTECTOMY     REPLACEMENT TOTAL KNEE  07/2005, 10/2010   bilateral   TONSILLECTOMY     TRACHEOSTOMY     VAGINAL HYSTERECTOMY  08/21/2007   for ovarian tumor    Current Medications: Current Meds  Medication Sig   albuterol  (VENTOLIN  HFA) 108 (90 Base) MCG/ACT inhaler Inhale 2 puffs into the lungs every 4 (four) hours as needed for wheezing or shortness of breath.   allopurinol (ZYLOPRIM) 300 MG tablet Take 300 mg by mouth daily.     amLODipine  (NORVASC ) 5 MG tablet Take 1 tablet (5 mg total) by mouth daily.   aspirin  EC 81 MG tablet Take 1 tablet (81 mg total) by mouth daily. Swallow whole.   EPINEPHrine 0.3 mg/0.3 mL IJ SOAJ injection Inject 0.3 mg into the skin once as needed for allergies.   furosemide  (LASIX ) 20 MG tablet Take 1 tablet (20 mg total) by mouth daily.   Magnesium 400 MG TABS Take 400 mg by mouth daily.   Multiple Vitamins-Minerals (CENTRUM WOMEN PO) Take 1 tablet by mouth every morning.   nitroGLYCERIN  (NITROSTAT ) 0.4 MG SL tablet Place 1 tablet (0.4 mg total) under the tongue every 5 (five)  minutes as needed.   pantoprazole  (PROTONIX ) 40 MG tablet Take 1 tablet (40 mg total) by mouth 2 (two) times daily.   Semaglutide,0.25 or 0.5MG /DOS, (OZEMPIC, 0.25 OR 0.5 MG/DOSE,) 2 MG/1.5ML SOPN Inject 0.5 mg into the skin once a week.   [DISCONTINUED] amLODipine  (NORVASC ) 5 MG tablet Take 1 tablet (5 mg total) by mouth daily.     Allergies:   Latex, Penicillins, Aspirin , Codeine, Dapagliflozin, and Erythromycin ethylsuccinate   Social History   Socioeconomic History   Marital status: Married    Spouse name: Not on file   Number of children: 2   Years of education: Not on file   Highest education level: Not on file  Occupational History   Occupation: RN  Tobacco Use   Smoking status: Never   Smokeless tobacco: Never  Substance and Sexual Activity   Alcohol use: No   Drug use: No   Sexual activity: Not on file  Other Topics Concern   Not on file  Social History Narrative   Not on file   Social Drivers of Health   Financial Resource Strain: Not on file  Food Insecurity: Not on file  Transportation Needs: Not on file  Physical Activity: Not on file  Stress: Not on file  Social Connections: Not on file     Family History: The patient's family history includes Breast cancer in her mother; Coronary artery disease in her father; Diabetes in her father; Heart attack in her brother and father; Multiple myeloma in her sister; Ovarian cancer in her mother.  ROS:   Please see the history of present illness.    All other systems reviewed and are negative.  EKGs/Labs/Other Studies Reviewed:    The following studies were reviewed today: .SABRA      Recent Labs: 08/19/2023: B Natriuretic Peptide 32.4 02/18/2024: NT-Pro BNP 72 03/19/2024: ALT 14; BUN 25; Creatinine, Ser 1.39; Hemoglobin 13.4; Platelets 233; Potassium 4.8; Sodium 145  Recent Lipid Panel    Component Value Date/Time   CHOL 186 03/19/2024 0940   TRIG 197 (H) 03/19/2024 0940   HDL 31 (L) 03/19/2024 0940    CHOLHDL 6.0 (H) 03/19/2024 0940   CHOLHDL 7.4 12/24/2007 0920  VLDL 21 12/24/2007 0920   LDLCALC 120 (H) 03/19/2024 0940   LDLDIRECT 165.5 06/24/2007 1545    Physical Exam:    VS:  BP (!) 153/79   Pulse (!) 56   Temp 97.6 F (36.4 C) (Oral)   Resp 17   Ht 5' 2 (1.575 m)   Wt 94.3 kg   SpO2 97%   BMI 38.04 kg/m     Wt Readings from Last 3 Encounters:  03/24/24 94.3 kg  03/19/24 95.7 kg  03/04/24 94.8 kg     GEN: Patient is in no acute distress HEENT: Normal NECK: No JVD; No carotid bruits LYMPHATICS: No lymphadenopathy CARDIAC: Hear sounds regular, 2/6 systolic murmur at the apex. RESPIRATORY:  Clear to auscultation without rales, wheezing or rhonchi  ABDOMEN: Soft, non-tender, non-distended MUSCULOSKELETAL:  No edema; No deformity  SKIN: Warm and dry NEUROLOGIC:  Alert and oriented x 3 PSYCHIATRIC:  Normal affect   Signed, Newman JINNY Lawrence, MD  03/24/2024 8:41 AM    [facility]

## 2024-03-24 NOTE — Telephone Encounter (Signed)
-----   Message from Jennifer JONELLE Crape sent at 03/23/2024  5:56 PM EDT ----- Not on statin. Start crestor  10 and ll 6wks. Kidney stable. Cc pcp Jennifer JONELLE Crape, MD 03/23/2024 5:55 PM  ----- Message ----- From: Rebecka Memos Lab Results In Sent: 03/19/2024   1:36 PM EDT To: Jennifer JONELLE Crape, MD

## 2024-04-17 NOTE — Progress Notes (Deleted)
 Cardiology Office Note   Date:  04/17/2024  ID:  Shyah Balingit, DOB 1945-09-30, MRN [id number] PCP: Clemmie Nest, MD  [facility] Providers Cardiologist:  Redell Leiter, MD     History of Present Illness [name] is a 78 y.o. female with past medical history of HFpEF, CKD stage III--followed by [facility] nephrology, diabetes, gout  11/01/2021 echo EF 60-65%, concentric LVH, grade 1 DD  She established with Dr. Leiter on 10/18/2021 for the evaluation of heart failure with a past of her PCP.  She previously resided in [place]  but they recently relocated back to   and had been having issues with controlling her blood pressure, her torsemide was transition back to furosemide  and had issues tolerating SGLT2 inhibitor secondary to syncope.  A repeat echocardiogram was arranged which was overall stable.  Most recently she was evaluated by Dr. Leiter on 09/18/2023, stable from a cardiac perspective no changes were made to her medications or plan of care and she was advised to follow-up in 6 months.  She was evaluated in the emergency department in February 2025 for an episode of chest pain, troponins were negative, CT of her chest revealed no episodes of PE.  She was evaluated on 02/18/2024 with concerns of DOE that have been progressing over the past 6 to 8 months, we arranged for repeat echocardiogram which was overall unchanged since her prior echo, and then sent her to have a Lexiscan  which was abnormal so she was subsequently evaluated by Dr. Edwyna with plans to undergo left heart cath.  This was completed on 03/24/2024 revealing normal coronary arteries LVEDP was elevated at 29 mmHg.  Jardiance  ROS: Review of Systems  Respiratory:  Positive for shortness of breath.   All other systems reviewed and are negative.    Studies Reviewed      Cardiac Studies & Procedures    ______________________________________________________________________________________________ CARDIAC CATHETERIZATION  CARDIAC CATHETERIZATION 03/24/2024  Conclusion   No indication for antiplatelet therapy at this time .   Defer Aspirin  discontinuation to referring cardiologist/PCP  Coronary angiography 03/24/2024: LM: Normal LAD: Normal, no significant disease Lcx: Normal, no significant disease RCA: Dominant. No significant disease  LVEDP 29 mmHg  No epicardial coronary artery disease Elevated LVEDP Consider management for possible HFpEF  Manish JINNY Lawrence, MD  Findings Coronary Findings Diagnostic  Dominance: Right  Left Main Vessel is normal in caliber. Vessel is angiographically normal.  Left Anterior Descending Vessel is normal in caliber. Vessel is angiographically normal.  Left Circumflex Vessel is normal in caliber. Vessel is angiographically normal.  Right Coronary Artery Vessel is normal in caliber. Vessel is angiographically normal.  Intervention  No interventions have been documented.   STRESS TESTS  MYOCARDIAL PERFUSION IMAGING 03/04/2024  Interpretation Summary   Findings are consistent with ischemia involving apica and mid portion of ths anterior wall. The study is intermediate risk.   No ST deviation was noted.   Left ventricular function is normal. Nuclear stress EF: 70%. The left ventricular ejection fraction is hyperdynamic (>65%). End diastolic cavity size is normal.   Prior study not available for comparison.   ECHOCARDIOGRAM  ECHOCARDIOGRAM COMPLETE 03/03/2024  Narrative ECHOCARDIOGRAM REPORT    Patient Name:   [name] Date of Exam: 03/03/2024 Medical Rec #:  [id number]      Height:       63.0 in Accession #:    [id number]     Weight:       209.0 lb Date of Birth:  1946/02/09  BSA:          1.970 m Patient Age:    78 years       BP:           120/80 mmHg Patient Gender: F              HR:           63 bpm. Exam  Location:  [place]  Procedure: 2D Echo, Cardiac Doppler, Color Doppler and Strain Analysis (Both Spectral and Color Flow Doppler were utilized during procedure).  Indications:    Hypertensive heart disease with heart failure (HCC) [I11.0 (ICD-10-CM)], Stage 3 chronic kidney disease, unspecified whether stage 3a or 3b CKD (HCC) [N18.30 (ICD-10-CM)], Dyslipidemia [E78.5 (ICD-10-CM)], DOE (dyspnea on exertion) [R06.09 (ICD-10-CM)]  History:        Patient has prior history of Echocardiogram examinations, most recent 11/01/2021. CHF; Risk Factors:Hypertension, Diabetes and Dyslipidemia.  Sonographer:    Saddie Chimes Referring Phys: [phone] Tykia Mellone C Renetta Suman  IMPRESSIONS   1. Left ventricular ejection fraction, by estimation, is 60 to 65%. The left ventricle has normal function. The left ventricle has no regional wall motion abnormalities. Left ventricular diastolic parameters are consistent with Grade I diastolic dysfunction (impaired relaxation). The average left ventricular global longitudinal strain is -19.2 %. The global longitudinal strain is normal. 2. Right ventricular systolic function is normal. The right ventricular size is normal. There is normal pulmonary artery systolic pressure. 3. The mitral valve is normal in structure. No evidence of mitral valve regurgitation. No evidence of mitral stenosis. 4. The aortic valve is normal in structure. Aortic valve regurgitation is not visualized. No aortic stenosis is present. 5. The inferior vena cava is normal in size with greater than 50% respiratory variability, suggesting right atrial pressure of 3 mmHg.  FINDINGS Left Ventricle: Left ventricular ejection fraction, by estimation, is 60 to 65%. The left ventricle has normal function. The left ventricle has no regional wall motion abnormalities. The average left ventricular global longitudinal strain is -19.2 %. Strain was performed and the global longitudinal strain is normal. The left  ventricular internal cavity size was normal in size. There is no left ventricular hypertrophy. Left ventricular diastolic parameters are consistent with Grade I diastolic dysfunction (impaired relaxation).  Right Ventricle: The right ventricular size is normal. No increase in right ventricular wall thickness. Right ventricular systolic function is normal. There is normal pulmonary artery systolic pressure. The tricuspid regurgitant velocity is 1.61 m/s, and with an assumed right atrial pressure of 3 mmHg, the estimated right ventricular systolic pressure is 13.4 mmHg.  Left [facility]: Left atrial size was normal in size.  Right [facility]: Right atrial size was normal in size.  Pericardium: There is no evidence of pericardial effusion.  Mitral Valve: The mitral valve is normal in structure. No evidence of mitral valve regurgitation. No evidence of mitral valve stenosis. MV peak gradient, 5.7 mmHg. The mean mitral valve gradient is 2.0 mmHg.  Tricuspid Valve: The tricuspid valve is normal in structure. Tricuspid valve regurgitation is not demonstrated. No evidence of tricuspid stenosis.  Aortic Valve: The aortic valve is normal in structure. Aortic valve regurgitation is not visualized. No aortic stenosis is present. Aortic valve mean gradient measures 5.5 mmHg. Aortic valve peak gradient measures 9.7 mmHg. Aortic valve area, by VTI measures 1.53 cm.  Pulmonic Valve: The pulmonic valve was normal in structure. Pulmonic valve regurgitation is not visualized. No evidence of pulmonic stenosis.  Aorta: The aortic root is normal in size  and structure.  Venous: The inferior vena cava is normal in size with greater than 50% respiratory variability, suggesting right atrial pressure of 3 mmHg.  IAS/Shunts: No atrial level shunt detected by color flow Doppler.   LEFT VENTRICLE PLAX 2D LVIDd:         4.10 cm   Diastology LVIDs:         2.70 cm   LV e' medial:    10.30 cm/s LV PW:         1.20 cm   LV  E/e' medial:  7.6 LV IVS:        1.10 cm   LV e' lateral:   6.64 cm/s LVOT diam:     1.90 cm   LV E/e' lateral: 11.8 LV SV:         63 LV SV Index:   32        2D Longitudinal Strain LVOT Area:     2.84 cm  2D Strain GLS Avg:     -19.2 %   RIGHT VENTRICLE RV Basal diam:  2.90 cm RV Mid diam:    2.00 cm TAPSE (M-mode): 1.9 cm  LEFT ATRIUM           Index LA diam:      3.90 cm 1.98 cm/m LA Vol (A4C): 31.7 ml 16.09 ml/m AORTIC VALVE                     PULMONIC VALVE AV Area (Vmax):    1.63 cm      PV Vmax:       1.36 m/s AV Area (Vmean):   1.56 cm      PV Vmean:      86.300 cm/s AV Area (VTI):     1.53 cm      PV VTI:        0.333 m AV Vmax:           155.50 cm/s   PV Peak grad:  7.4 mmHg AV Vmean:          112.000 cm/s  PV Mean grad:  4.0 mmHg AV VTI:            0.409 m AV Peak Grad:      9.7 mmHg AV Mean Grad:      5.5 mmHg LVOT Vmax:         89.40 cm/s LVOT Vmean:        61.700 cm/s LVOT VTI:          0.220 m LVOT/AV VTI ratio: 0.54  AORTA Ao Asc diam: 3.20 cm  MITRAL VALVE                TRICUSPID VALVE MV Area (PHT): 2.55 cm     TR Peak grad:   10.4 mmHg MV Area VTI:   1.05 cm     TR Vmax:        161.00 cm/s MV Peak grad:  5.7 mmHg MV Mean grad:  2.0 mmHg     SHUNTS MV Vmax:       1.19 m/s     Systemic VTI:  0.22 m MV Vmean:      61.6 cm/s    Systemic Diam: 1.90 cm MV Decel Time: 297 msec MV E velocity: 78.10 cm/s MV A velocity: 115.00 cm/s MV E/A ratio:  0.68  Milind Raether Crape MD Electronically signed by Sandon Yoho Crape MD Signature Date/Time: 03/04/2024/1:39:41 PM    Final  ______________________________________________________________________________________________      Risk Assessment/Calculations   No BP recorded.  {Refresh Note OR Click here to enter BP  :1}***       Physical Exam VS:  There were no vitals taken for this visit.       Wt Readings from Last 3 Encounters:  03/24/24 208 lb (94.3 kg)  03/19/24 211 lb (95.7 kg)   03/04/24 209 lb (94.8 kg)    GEN: Well nourished, well developed in no acute distress NECK: No JVD; No carotid bruits CARDIAC: RRR, no murmurs, rubs, gallops RESPIRATORY:  Clear to auscultation without rales, wheezing or rhonchi  ABDOMEN: Soft, non-tender, non-distended EXTREMITIES:  No edema; No deformity   ASSESSMENT AND PLAN HFpEF -  NYHA class I-II for dyspnea but not entirely clear if this is d/t heart failure. She is euvolemic. Will check proBNP, echocardiogram.  Continue Lasix  20 mg daily, spironolactone 25 mg daily.  DOE-will check echocardiogram, could be anginal equivalent however she has known CKD follows closely with nephrologist so I think we should arrange for an ischemic evaluation with a Lexiscan -as she is not able to exercise secondary to bilateral knee pain.  If we cannot can find any contributory causes for DOE from a cardiac perspective, consider sending her to [facility] pulmonology.  CKD III -most recent GFR 41, follows with [facility].  HTN -her blood pressure is well-controlled today at 120/80, currently on spironolactone 25 mg daily     Informed Consent   Shared Decision Making/Informed Consent The risks [chest pain, shortness of breath, cardiac arrhythmias, dizziness, blood pressure fluctuations, myocardial infarction, stroke/transient ischemic attack, nausea, vomiting, allergic reaction, radiation exposure, metallic taste sensation and life-threatening complications (estimated to be 1 in 10,000)], benefits (risk stratification, diagnosing coronary artery disease, treatment guidance) and alternatives of a nuclear stress test were discussed in detail with Ms. Beldin and she agrees to proceed.     Dispo: Lexiscan , echocardiogram, proBNP and CMET today, follow-up in 3 months.  Signed, Delon JAYSON Hoover, NP

## 2024-04-21 ENCOUNTER — Ambulatory Visit: Admitting: Cardiology

## 2024-04-21 ENCOUNTER — Encounter: Payer: Self-pay | Admitting: *Deleted

## 2024-04-21 DIAGNOSIS — I5032 Chronic diastolic (congestive) heart failure: Secondary | ICD-10-CM

## 2024-04-21 DIAGNOSIS — M51369 Other intervertebral disc degeneration, lumbar region without mention of lumbar back pain or lower extremity pain: Secondary | ICD-10-CM | POA: Insufficient documentation

## 2024-04-21 DIAGNOSIS — R0609 Other forms of dyspnea: Secondary | ICD-10-CM

## 2024-04-21 DIAGNOSIS — I10 Essential (primary) hypertension: Secondary | ICD-10-CM

## 2024-04-21 DIAGNOSIS — N183 Chronic kidney disease, stage 3 unspecified: Secondary | ICD-10-CM

## 2024-04-21 NOTE — Progress Notes (Unsigned)
 Cardiology Office Note   Date:  04/24/2024  ID:  [name], [name] [name] 02-Jan-1946, MRN [id number] PCP: [name] Nest, MD  [facility] Providers Cardiologist:  [name] Leiter, MD     History of Present Illness [name] [name] [name] is a 78 y.o. female with past medical history of HFpEF, CKD stage III--followed by [facility] nephrology, diabetes, gout.  03/24/2024 cardiac cath angiographically normal coronary arteries, LVEDP moderately elevated 03/04/2024 Lexiscan  findings consistent with ischemia involving the apical and midportion of the anterior wall, intermediate risk 03/03/2024 echo EF 60 to 65%, grade 1 DD, no valvular abnormalities 11/01/2021 echo EF 60-65%, concentric LVH, grade 1 DD  She established with Dr. Leiter on 10/18/2021 for the evaluation of heart failure with a past of her PCP.  She previously resided in [place]  but they recently relocated back to [place]  and had been having issues with controlling her blood pressure, her torsemide was transition back to furosemide  and had issues tolerating SGLT2 inhibitor secondary to syncope.  A repeat echocardiogram was arranged which was overall stable.  Most recently she was evaluated by Dr. Leiter on 09/18/2023, stable from a cardiac perspective no changes were made to her medications or plan of care and she was advised to follow-up in 6 months.  She was evaluated in the emergency department in February 2025 for an episode of chest pain, troponins were negative, CT of her chest revealed no episodes of PE.  She was evaluated on 02/18/2024 with concerns of DOE that have been progressing over the past 6 to 8 months, we arranged for repeat echocardiogram which was overall unchanged since her prior echo, and then sent her to have a Lexiscan  which was abnormal so she was subsequently evaluated by Dr. Edwyna with plans to undergo left heart cath.  This was completed on 03/24/2024 revealing normal coronary arteries LVEDP was elevated at 29 mmHg.  She  presents today accompanied by her husband for follow-up after recent heart cath as outlined above.  Her pravastatin  had been stopped, she was started on Crestor , her spironolactone  was stopped and she was started on amlodipine --she is not feeling well with either of these medications.  She is bothered by joint pain--this apparently previously happened when she was on moderate intensity statin in the past as well.  Her blood pressure has not been controlled and she simply wants to return to the medication she was previously on.  Her breathing is unchanged, we discussed referring her to pulmonology for further evaluation for causes of shortness of breath.  She does have intermittent pedal edema.  She denies chest pain, palpitations, pnd, orthopnea, n, v, dizziness, syncope, weight gain, or early satiety.     ROS: Review of Systems  Respiratory:  Positive for shortness of breath.   Cardiovascular:  Positive for leg swelling.  All other systems reviewed and are negative.    Studies Reviewed      Cardiac Studies & Procedures   ______________________________________________________________________________________________ CARDIAC CATHETERIZATION  CARDIAC CATHETERIZATION 03/24/2024  Conclusion   No indication for antiplatelet therapy at this time .   Defer Aspirin  discontinuation to referring cardiologist/PCP  Coronary angiography 03/24/2024: LM: Normal LAD: Normal, no significant disease Lcx: Normal, no significant disease RCA: Dominant. No significant disease  LVEDP 29 mmHg  No epicardial coronary artery disease Elevated LVEDP Consider management for possible HFpEF  [name] JINNY Lawrence, MD  Findings Coronary Findings Diagnostic  Dominance: Right  Left Main Vessel is normal in caliber. Vessel is angiographically normal.  Left Anterior Descending Vessel  is normal in caliber. Vessel is angiographically normal.  Left Circumflex Vessel is normal in caliber. Vessel is angiographically  normal.  Right Coronary Artery Vessel is normal in caliber. Vessel is angiographically normal.  Intervention  No interventions have been documented.   STRESS TESTS  MYOCARDIAL PERFUSION IMAGING 03/04/2024  Interpretation Summary   Findings are consistent with ischemia involving apica and mid portion of ths anterior wall. The study is intermediate risk.   No ST deviation was noted.   Left ventricular function is normal. Nuclear stress EF: 70%. The left ventricular ejection fraction is hyperdynamic (>65%). End diastolic cavity size is normal.   Prior study not available for comparison.   ECHOCARDIOGRAM  ECHOCARDIOGRAM COMPLETE 03/03/2024  Narrative ECHOCARDIOGRAM REPORT    Patient Name:   [name] [name] [name] Date of Exam: 03/03/2024 Medical Rec #:  [id number]      Height:       63.0 in Accession #:    [id number]     Weight:       209.0 lb Date of Birth:  11-23-1945      BSA:          1.970 m Patient Age:    78 years       BP:           120/80 mmHg Patient Gender: F              HR:           63 bpm. Exam Location:  [place]  Procedure: 2D Echo, Cardiac Doppler, Color Doppler and Strain Analysis (Both Spectral and Color Flow Doppler were utilized during procedure).  Indications:    Hypertensive heart disease with heart failure (HCC) [I11.0 (ICD-10-CM)], Stage 3 chronic kidney disease, unspecified whether stage 3a or 3b CKD (HCC) [N18.30 (ICD-10-CM)], Dyslipidemia [E78.5 (ICD-10-CM)], DOE (dyspnea on exertion) [R06.09 (ICD-10-CM)]  History:        Patient has prior history of Echocardiogram examinations, most recent 11/01/2021. CHF; Risk Factors:Hypertension, Diabetes and Dyslipidemia.  Sonographer:    [name] [name] [name] Referring Phys: [phone] [name] [name] [name]  IMPRESSIONS   1. Left ventricular ejection fraction, by estimation, is 60 to 65%. The left ventricle has normal function. The left ventricle has no regional wall motion abnormalities. Left ventricular diastolic parameters  are consistent with Grade I diastolic dysfunction (impaired relaxation). The average left ventricular global longitudinal strain is -19.2 %. The global longitudinal strain is normal. 2. Right ventricular systolic function is normal. The right ventricular size is normal. There is normal pulmonary artery systolic pressure. 3. The mitral valve is normal in structure. No evidence of mitral valve regurgitation. No evidence of mitral stenosis. 4. The aortic valve is normal in structure. Aortic valve regurgitation is not visualized. No aortic stenosis is present. 5. The inferior vena cava is normal in size with greater than 50% respiratory variability, suggesting right atrial pressure of 3 mmHg.  FINDINGS Left Ventricle: Left ventricular ejection fraction, by estimation, is 60 to 65%. The left ventricle has normal function. The left ventricle has no regional wall motion abnormalities. The average left ventricular global longitudinal strain is -19.2 %. Strain was performed and the global longitudinal strain is normal. The left ventricular internal cavity size was normal in size. There is no left ventricular hypertrophy. Left ventricular diastolic parameters are consistent with Grade I diastolic dysfunction (impaired relaxation).  Right Ventricle: The right ventricular size is normal. No increase in right ventricular wall thickness. Right ventricular systolic function is normal. There is normal pulmonary artery  systolic pressure. The tricuspid regurgitant velocity is 1.61 m/s, and with an assumed right atrial pressure of 3 mmHg, the estimated right ventricular systolic pressure is 13.4 mmHg.  Left [facility]: Left atrial size was normal in size.  Right [facility]: Right atrial size was normal in size.  Pericardium: There is no evidence of pericardial effusion.  Mitral Valve: The mitral valve is normal in structure. No evidence of mitral valve regurgitation. No evidence of mitral valve stenosis. MV peak gradient,  5.7 mmHg. The mean mitral valve gradient is 2.0 mmHg.  Tricuspid Valve: The tricuspid valve is normal in structure. Tricuspid valve regurgitation is not demonstrated. No evidence of tricuspid stenosis.  Aortic Valve: The aortic valve is normal in structure. Aortic valve regurgitation is not visualized. No aortic stenosis is present. Aortic valve mean gradient measures 5.5 mmHg. Aortic valve peak gradient measures 9.7 mmHg. Aortic valve area, by VTI measures 1.53 cm.  Pulmonic Valve: The pulmonic valve was normal in structure. Pulmonic valve regurgitation is not visualized. No evidence of pulmonic stenosis.  Aorta: The aortic root is normal in size and structure.  Venous: The inferior vena cava is normal in size with greater than 50% respiratory variability, suggesting right atrial pressure of 3 mmHg.  IAS/Shunts: No atrial level shunt detected by color flow Doppler.   LEFT VENTRICLE PLAX 2D LVIDd:         4.10 cm   Diastology LVIDs:         2.70 cm   LV e' medial:    10.30 cm/s LV PW:         1.20 cm   LV E/e' medial:  7.6 LV IVS:        1.10 cm   LV e' lateral:   6.64 cm/s LVOT diam:     1.90 cm   LV E/e' lateral: 11.8 LV SV:         63 LV SV Index:   32        2D Longitudinal Strain LVOT Area:     2.84 cm  2D Strain GLS Avg:     -19.2 %   RIGHT VENTRICLE RV Basal diam:  2.90 cm RV Mid diam:    2.00 cm TAPSE (M-mode): 1.9 cm  LEFT ATRIUM           Index LA diam:      3.90 cm 1.98 cm/m LA Vol (A4C): 31.7 ml 16.09 ml/m AORTIC VALVE                     PULMONIC VALVE AV Area (Vmax):    1.63 cm      PV Vmax:       1.36 m/s AV Area (Vmean):   1.56 cm      PV Vmean:      86.300 cm/s AV Area (VTI):     1.53 cm      PV VTI:        0.333 m AV Vmax:           155.50 cm/s   PV Peak grad:  7.4 mmHg AV Vmean:          112.000 cm/s  PV Mean grad:  4.0 mmHg AV VTI:            0.409 m AV Peak Grad:      9.7 mmHg AV Mean Grad:      5.5 mmHg LVOT Vmax:         89.40 cm/s LVOT  Vmean:        61.700 cm/s LVOT VTI:          0.220 m LVOT/AV VTI ratio: 0.54  AORTA Ao Asc diam: 3.20 cm  MITRAL VALVE                TRICUSPID VALVE MV Area (PHT): 2.55 cm     TR Peak grad:   10.4 mmHg MV Area VTI:   1.05 cm     TR Vmax:        161.00 cm/s MV Peak grad:  5.7 mmHg MV Mean grad:  2.0 mmHg     SHUNTS MV Vmax:       1.19 m/s     Systemic VTI:  0.22 m MV Vmean:      61.6 cm/s    Systemic Diam: 1.90 cm MV Decel Time: 297 msec MV E velocity: 78.10 cm/s MV A velocity: 115.00 cm/s MV E/A ratio:  0.68  Tayven Renteria Revankar MD Electronically signed by Areeb Corron Crape MD Signature Date/Time: 03/04/2024/1:39:41 PM    Final          ______________________________________________________________________________________________      Risk Assessment/Calculations    Physical Exam VS:  BP (!) 151/72   Pulse 73   Ht 5' 2 (1.575 m)   Wt 212 lb (96.2 kg)   SpO2 96%   BMI 38.78 kg/m        Wt Readings from Last 3 Encounters:  04/24/24 212 lb (96.2 kg)  03/24/24 208 lb (94.3 kg)  03/19/24 211 lb (95.7 kg)    GEN: Well nourished, well developed in no acute distress NECK: No JVD; No carotid bruits CARDIAC: RRR, no murmurs, rubs, gallops RESPIRATORY:  Clear to auscultation without rales, wheezing or rhonchi  ABDOMEN: Soft, non-tender, non-distended EXTREMITIES:  No edema; No deformity   ASSESSMENT AND PLAN HFpEF -  NYHA class I-II for dyspnea.  Her Lasix  and spironolactone  has been stopped and she would like to restart both of them.  Repeat BMET, she does have some underlying kidney dysfunction and her potassium has previously been on the high normal side however the Lasix  will hopefully take care of that.  Plan to restart Lasix  10 mg as needed for pedal edema, as well as spironolactone  25 mg daily.  DOE-echocardiogram was unrevealing, she did have a stress evaluation which revealed ischemia >> left heart cath revealed normal coronary arteries but her LVEDP was  elevated, again her spironolactone  and Lasix  had been stopped and we plan on restarting those. Will refer to pulmonology.   CKD III -most recent GFR 39, follows with [facility].  HTN -her blood pressure is elevated but it was better controlled on spironolactone , she would like to stop her amlodipine  and restart spironolactone , again checking labs to make sure we can restart her on this.  Dyslipidemia -her LDL is 120, she was started on Crestor  but she cannot tolerate this secondary to myalgias she request to go back on her pravastatin , we will discontinue her Crestor  and restart her on pravastatin  since she did not have any significant coronary artery disease.  Repeat FLP and CMET in 6 weeks.          Dispo: Refer to pulmonology, restart Lasix  10 mg as needed for pedal edema, restart spironolactone  25 mg daily--pending her BMET today, stop Crestor  and restart pravastatin  40 mg daily, repeat lipid panel in 6 weeks, follow-up with Dr. Monetta in 6 months.  Signed, Delon JAYSON Hoover, NP

## 2024-04-24 ENCOUNTER — Encounter: Payer: Self-pay | Admitting: Cardiology

## 2024-04-24 ENCOUNTER — Ambulatory Visit: Attending: Cardiology | Admitting: Cardiology

## 2024-04-24 VITALS — BP 151/72 | HR 73 | Ht 62.0 in | Wt 212.0 lb

## 2024-04-24 DIAGNOSIS — I10 Essential (primary) hypertension: Secondary | ICD-10-CM | POA: Diagnosis present

## 2024-04-24 DIAGNOSIS — I5032 Chronic diastolic (congestive) heart failure: Secondary | ICD-10-CM | POA: Diagnosis present

## 2024-04-24 DIAGNOSIS — N183 Chronic kidney disease, stage 3 unspecified: Secondary | ICD-10-CM

## 2024-04-24 DIAGNOSIS — R0609 Other forms of dyspnea: Secondary | ICD-10-CM | POA: Diagnosis not present

## 2024-04-24 DIAGNOSIS — E782 Mixed hyperlipidemia: Secondary | ICD-10-CM | POA: Diagnosis present

## 2024-04-24 MED ORDER — FUROSEMIDE 20 MG PO TABS: 10.0000 mg | ORAL_TABLET | ORAL | 3 refills | Status: DC | PRN

## 2024-04-24 MED ORDER — PRAVASTATIN SODIUM 40 MG PO TABS: 40.0000 mg | ORAL_TABLET | Freq: Every evening | ORAL | 3 refills | Status: AC

## 2024-04-24 MED ORDER — SPIRONOLACTONE 25 MG PO TABS: 25.0000 mg | ORAL_TABLET | Freq: Every day | ORAL | 3 refills | Status: AC

## 2024-04-24 NOTE — Patient Instructions (Signed)
 Medication Instructions:  Your physician has recommended you make the following change in your medication:   STOP: Crestor  STOP: Amlodipine  START: Pravastatin  40 mg daily START: Spirinolactone 25 mg daily START: Lasix  10 mg as needed for fluid and weight gain  *If you need a refill on your cardiac medications before your next appointment, please call your pharmacy*  Lab Work: Your physician recommends that you return for lab work in:   Labs today: CMP Labs in 6 weeks: CMP, Lipids  If you have labs (blood work) drawn today and your tests are completely normal, you will receive your results only by: [employer] (if you have MyChart) OR A paper copy in the mail If you have any lab test that is abnormal or we need to change your treatment, we will call you to review the results.  Testing/Procedures: None  Follow-Up: At [facility], you and your health needs are our priority.  As part of our continuing mission to provide you with exceptional heart care, our providers are all part of one team.  This team includes your primary Cardiologist (physician) and Advanced Practice Providers or APPs (Physician Assistants and Nurse Practitioners) who all work together to provide you with the care you need, when you need it.  Your next appointment:   6 month(s)  Provider:   Redell Leiter, MD    We recommend signing up for the patient portal called MyChart.  Sign up information is provided on this After Visit Summary.  MyChart is used to connect with patients for Virtual Visits (Telemedicine).  Patients are able to view lab/test results, encounter notes, upcoming appointments, etc.  Non-urgent messages can be sent to your provider as well.   To learn more about what you can do with MyChart, go to [email or web address].   Other Instructions None

## 2024-04-25 LAB — COMPREHENSIVE METABOLIC PANEL WITH GFR
ALT: 14 IU/L (ref 0–32)
AST: 15 IU/L (ref 0–40)
Albumin: 4.2 g/dL (ref 3.8–4.8)
Alkaline Phosphatase: 98 IU/L (ref 44–121)
BUN/Creatinine Ratio: 11 — ABNORMAL LOW (ref 12–28)
BUN: 17 mg/dL (ref 8–27)
Bilirubin Total: 0.7 mg/dL (ref 0.0–1.2)
CO2: 22 mmol/L (ref 20–29)
Calcium: 9.3 mg/dL (ref 8.7–10.3)
Chloride: 103 mmol/L (ref 96–106)
Creatinine, Ser: 1.54 mg/dL — ABNORMAL HIGH (ref 0.57–1.00)
Globulin, Total: 1.9 g/dL (ref 1.5–4.5)
Glucose: 140 mg/dL — ABNORMAL HIGH (ref 70–99)
Potassium: 4.4 mmol/L (ref 3.5–5.2)
Sodium: 142 mmol/L (ref 134–144)
Total Protein: 6.1 g/dL (ref 6.0–8.5)
eGFR: 35 mL/min/1.73 — ABNORMAL LOW

## 2024-04-27 ENCOUNTER — Ambulatory Visit: Payer: Self-pay | Admitting: Cardiology

## 2024-04-27 DIAGNOSIS — I5032 Chronic diastolic (congestive) heart failure: Secondary | ICD-10-CM

## 2024-04-28 ENCOUNTER — Telehealth: Payer: Self-pay

## 2024-04-28 DIAGNOSIS — I10 Essential (primary) hypertension: Secondary | ICD-10-CM

## 2024-04-28 NOTE — Telephone Encounter (Signed)
-----   Message from Delon JAYSON Hoover sent at 04/27/2024  8:29 AM EDT ----- Creatinine is 1.54, GFR 35, the cut off for spironolactone  if GFR < 30, which she really wanted to restart for her hypertension.  Do not add any unnecessary potassium supplements to your diet, as spironolactone  increases your potassium level as does kidney dysfunction.  Make sure you are drinking adequate water , 48 ounces per day.  Repeat BMET in 4 days, and then again in 2 weeks.  ----- Message ----- From: Rebecka Memos Lab Results In Sent: 04/25/2024   5:39 AM EDT To: Delon JAYSON Hoover, NP

## 2024-04-28 NOTE — Telephone Encounter (Signed)
 MyChart message  Viewed in MyChart Routed to PCP

## 2024-04-28 NOTE — Telephone Encounter (Signed)
 My chart message from Delon Hoover, NP sent to Pt Creatinine is 1.54, GFR 35, the cut off for spironolactone  if GFR < 30, which she really wanted to restart for her hypertension.   Do not add any unnecessary potassium supplements to your diet, as spironolactone  increases your potassium level as does kidney dysfunction.   Make sure you are drinking adequate water , 48 ounces per day.   Repeat BMET in 4 days, and then again in 2 weeks.   BMP ordered per note

## 2024-04-30 LAB — BASIC METABOLIC PANEL WITH GFR
BUN/Creatinine Ratio: 13 (ref 12–28)
BUN: 14 mg/dL (ref 8–27)
CO2: 23 mmol/L (ref 20–29)
Calcium: 9.5 mg/dL (ref 8.7–10.3)
Chloride: 105 mmol/L (ref 96–106)
Creatinine, Ser: 1.08 mg/dL — ABNORMAL HIGH (ref 0.57–1.00)
Glucose: 127 mg/dL — ABNORMAL HIGH (ref 70–99)
Potassium: 4.6 mmol/L (ref 3.5–5.2)
Sodium: 144 mmol/L (ref 134–144)
eGFR: 53 mL/min/1.73 — ABNORMAL LOW

## 2024-06-12 ENCOUNTER — Ambulatory Visit (INDEPENDENT_AMBULATORY_CARE_PROVIDER_SITE_OTHER): Admitting: Pulmonary Disease

## 2024-06-12 ENCOUNTER — Encounter: Payer: Self-pay | Admitting: Pulmonary Disease

## 2024-06-12 VITALS — BP 141/61 | HR 72 | Ht 62.0 in | Wt 211.2 lb

## 2024-06-12 DIAGNOSIS — R0683 Snoring: Secondary | ICD-10-CM

## 2024-06-12 DIAGNOSIS — J4520 Mild intermittent asthma, uncomplicated: Secondary | ICD-10-CM | POA: Diagnosis not present

## 2024-06-12 DIAGNOSIS — R0602 Shortness of breath: Secondary | ICD-10-CM

## 2024-06-12 MED ORDER — FLUTICASONE-SALMETEROL 115-21 MCG/ACT IN AERO: 2.0000 | INHALATION_SPRAY | Freq: Two times a day (BID) | RESPIRATORY_TRACT | 12 refills | Status: DC

## 2024-06-12 NOTE — Patient Instructions (Signed)
 Schedule pulmonary function tests at the front desk   I will be in touch with you once we have the results  Schedule home sleep study  Start advair HFA inhaler 2 puffs twice daily - rinse mouth out after each use  Follow up in 3 months, call sooner if needed

## 2024-06-12 NOTE — Progress Notes (Signed)
 Subjective:   PATIENT ID: Blease Kettering GENDER: female DOB: 06-12-1946, MRN: [id number]   HPI Discussed the use of AI scribe software for clinical note transcription with the patient, who gave verbal consent to proceed.  History of Present Illness [name] is a 78 year old female with congestive heart failure who presents with shortness of breath and fatigue.   She has experienced shortness of breath and fatigue for six months, with associated chest pain evaluated by her cardiologist. Occasional wheezing is noted by others, though she does not perceive it. She experiences shortness of breath upon waking and has a long-standing history of snoring. A sleep study in 2006 showed no sleep apnea. She previously used oxygen at night without continued benefit.  She uses Lasix  daily for chronic swelling in her feet and legs, which has not worsened. Albuterol  inhalers no longer provide relief. There have been no significant medication changes in the past six months.  She has never smoked, though her father smoked during her childhood. She worked third shift for many years, contributing to poor sleep.    Past Medical History:  Diagnosis Date   Adjustment insomnia    ANKLE PAIN, RIGHT 07/24/2007   Qualifier: Diagnosis of  By: Krystal MD, Reyes LABOR    ASTHMA 11/07/2007   Qualifier: Diagnosis of   By: Latisha CMA, Leigh      Replacing diagnoses that were inactivated after the 11/19/22 regulatory import     Barrett's esophagus 08/2011   CHF (congestive heart failure) (HCC)    CHOLELITHIASIS 04/19/2008   Qualifier: Diagnosis of   By: Krystal MD, Reyes A     Replacing diagnoses that were inactivated after the 11/19/22 regulatory import     Chronic kidney disease, stage 3 unspecified (HCC) 09/22/2018   DEPRESSION 03/21/2007   Qualifier: Diagnosis of  By: Krystal RN, Leeroy     DIABETES MELLITUS, TYPE II 12/02/2007   Qualifier: Diagnosis of  By: Krystal MD, Reyes LABOR    Diaphoresis 08/10/2013    Disturbance of skin sensation 10/10/2007   Qualifier: Diagnosis of  By: Krystal MD, Reyes LABOR    Dyslipidemia 03/21/2007   Qualifier: Diagnosis of   By: Krystal OBIE Leeroy CARMIN 12/10/2008   Qualifier: Diagnosis of  By: Aneita MD NOLIA Gist T    ESOPHAGEAL STRICTURE 12/09/2008   Qualifier: Diagnosis of  By: Earlean CMA (AAMA), Amanda     Fibromyalgia    GASTRITIS 11/25/2007   Qualifier: Diagnosis of   By: Rosabel Penny Balm, Norchel      Replacing diagnoses that were inactivated after the 11/19/22 regulatory import     GASTROESOPHAGEAL REFLUX DISEASE 11/25/2007   Qualifier: Diagnosis of  By: Rosabel Penny Balm, Norchel     GOUT 12/02/2007   Qualifier: Diagnosis of  By: Krystal MD, Reyes LABOR    HEMORRHOIDS 12/09/2008   Qualifier: Diagnosis of   By: Lewellyn CMA (AAMA), Amanda      Replacing diagnoses that were inactivated after the 11/19/22 regulatory import     History of anaphylaxis 09/22/2018   History of colonic polyps 12/09/2008   Qualifier: Diagnosis of   By: Lewellyn CMA (AAMA), Amanda      IMO SNOMED Dx Update Oct 2024     Hyperlipidemia 09/22/2018   HYPERTENSION 03/21/2007   Qualifier: Diagnosis of   By: Krystal, RN, Leeroy      Replacing diagnoses that were inactivated after the 11/19/22 regulatory import  LUNG NODULE 11/10/2007   Qualifier: Diagnosis of  By: Neysa MD, Clinton D    OBESITY NOS 03/21/2007   Qualifier: Diagnosis of   By: Krystal RN, Leeroy         OBSTRUCTIVE SLEEP APNEA 11/07/2007   Qualifier: Diagnosis of  By: Latisha CMA, Leigh     Ovarian cancer (HCC) 2009   Sleep pattern disturbance 01/11/2020   UNSPECIFIED MYALGIA AND MYOSITIS 11/25/2007   Qualifier: Diagnosis of  By: Krystal MD, Reyes LABOR      Family History  Problem Relation Age of Onset   Breast cancer Mother    Ovarian cancer Mother    Coronary artery disease Father    Diabetes Father    Heart attack Father    Multiple myeloma Sister    Heart attack Brother      Social History   Socioeconomic  History   Marital status: Married    Spouse name: Not on file   Number of children: 2   Years of education: Not on file   Highest education level: Not on file  Occupational History   Occupation: RN  Tobacco Use   Smoking status: Never   Smokeless tobacco: Never  Substance and Sexual Activity   Alcohol use: No   Drug use: No   Sexual activity: Not on file  Other Topics Concern   Not on file  Social History Narrative   Not on file   Social Drivers of Health   Financial Resource Strain: Not on file  Food Insecurity: Not on file  Transportation Needs: Not on file  Physical Activity: Not on file  Stress: Not on file  Social Connections: Not on file  Intimate Partner Violence: Not on file     Allergies  Allergen Reactions   Latex Anaphylaxis   Penicillins Anaphylaxis   Aspirin      REACTION: bruising   Codeine    Dapagliflozin    Erythromycin Ethylsuccinate     REACTION: rash     Outpatient Medications Prior to Visit  Medication Sig Dispense Refill   albuterol  (VENTOLIN  HFA) 108 (90 Base) MCG/ACT inhaler Inhale 2 puffs into the lungs every 4 (four) hours as needed for wheezing or shortness of breath. 1 each 0   allopurinol (ZYLOPRIM) 300 MG tablet Take 300 mg by mouth daily.       aspirin  EC 81 MG tablet Take 1 tablet (81 mg total) by mouth daily. Swallow whole.     EPINEPHrine 0.3 mg/0.3 mL IJ SOAJ injection Inject 0.3 mg into the skin once as needed for allergies.     Magnesium 400 MG TABS Take 400 mg by mouth daily.     Multiple Vitamins-Minerals (CENTRUM WOMEN PO) Take 1 tablet by mouth every morning.     nitroGLYCERIN  (NITROSTAT ) 0.4 MG SL tablet Place 1 tablet (0.4 mg total) under the tongue every 5 (five) minutes as needed. 25 tablet 6   pantoprazole  (PROTONIX ) 40 MG tablet Take 1 tablet (40 mg total) by mouth 2 (two) times daily. 60 tablet 0   pravastatin  (PRAVACHOL ) 40 MG tablet Take 1 tablet (40 mg total) by mouth every evening. 90 tablet 3   Semaglutide,0.25  or 0.5MG /DOS, (OZEMPIC, 0.25 OR 0.5 MG/DOSE,) 2 MG/1.5ML SOPN Inject 0.5 mg into the skin once a week.     spironolactone  (ALDACTONE ) 25 MG tablet Take 1 tablet (25 mg total) by mouth daily. 90 tablet 3   benzonatate  (TESSALON ) 100 MG capsule Take 1 capsule (100 mg total) by mouth  3 (three) times daily as needed for cough. (Patient not taking: Reported on 06/12/2024) 21 capsule 0   furosemide  (LASIX ) 20 MG tablet Take 0.5 tablets (10 mg total) by mouth as needed for edema or fluid. (Patient not taking: Reported on 06/12/2024) 90 tablet 3   No facility-administered medications prior to visit.   Review of Systems  Constitutional:  Negative for chills, fever, malaise/fatigue and weight loss.  HENT:  Negative for congestion, sinus pain and sore throat.   Eyes: Negative.   Respiratory:  Positive for shortness of breath. Negative for cough, hemoptysis, sputum production and wheezing.   Cardiovascular:  Positive for leg swelling. Negative for chest pain, palpitations, orthopnea and claudication.  Gastrointestinal:  Negative for abdominal pain, heartburn, nausea and vomiting.  Genitourinary: Negative.   Musculoskeletal:  Negative for joint pain and myalgias.  Skin:  Negative for rash.  Neurological:  Negative for weakness.  Endo/Heme/Allergies: Negative.   Psychiatric/Behavioral: Negative.      Objective:   Vitals:   06/12/24 0944  BP: (!) 141/61  Pulse: 72  SpO2: 99%  Weight: 211 lb 3.2 oz (95.8 kg)  Height: 5' 2 (1.575 m)   Physical Exam Constitutional:      General: She is not in acute distress.    Appearance: Normal appearance. She is obese.  Eyes:     General: No scleral icterus.    Conjunctiva/sclera: Conjunctivae normal.  Cardiovascular:     Rate and Rhythm: Normal rate and regular rhythm.  Pulmonary:     Breath sounds: No wheezing, rhonchi or rales.  Musculoskeletal:     Right lower leg: No edema.     Left lower leg: No edema.  Skin:    General: Skin is warm and dry.   Neurological:     General: No focal deficit present.    CBC    Component Value Date/Time   WBC 6.5 03/19/2024 0940   WBC 10.6 (H) 10/17/2023 0100   RBC 4.60 03/19/2024 0940   RBC 4.76 10/17/2023 0100   HGB 13.4 03/19/2024 0940   HCT 41.1 03/19/2024 0940   PLT 233 03/19/2024 0940   MCV 89 03/19/2024 0940   MCH 29.1 03/19/2024 0940   MCH 29.4 10/17/2023 0100   MCHC 32.6 03/19/2024 0940   MCHC 34.0 10/17/2023 0100   RDW 13.5 03/19/2024 0940   LYMPHSABS 2.0 03/19/2024 0940   MONOABS 1.0 08/19/2023 1131   EOSABS 0.2 03/19/2024 0940   BASOSABS 0.1 03/19/2024 0940      Latest Ref Rng & Units 04/29/2024   10:11 AM 04/24/2024   11:21 AM 03/19/2024    9:40 AM  BMP  Glucose 70 - 99 mg/dL 872  859  875   BUN 8 - 27 mg/dL 14  17  25    Creatinine 0.57 - 1.00 mg/dL 8.91  8.45  8.60   BUN/Creat Ratio 12 - 28 13  11  18    Sodium 134 - 144 mmol/L 144  142  145   Potassium 3.5 - 5.2 mmol/L 4.6  4.4  4.8   Chloride 96 - 106 mmol/L 105  103  105   CO2 20 - 29 mmol/L 23  22  21    Calcium  8.7 - 10.3 mg/dL 9.5  9.3  8.6    Chest imaging: CT Chest 10/17/23 No evidence of pulmonary emboli.   Stable subpleural nodules dating back to 2013 consistent with benign etiology. No further follow-up is recommended.   Aortic Atherosclerosis (ICD10-I70.0).  PFT:  No data to display          Labs:  Path:  Echo 02/2024: LV EF 60-65%. Grade I diastolic dysfunction. RV size and systolic function ar enormal.   Heart Catheterization:     Assessment & Plan:   Shortness of breath  Snoring - Plan: Home sleep test  Mild intermittent reactive airway disease without complication - Plan: fluticasone-salmeterol (ADVAIR HFA) 115-21 MCG/ACT inhaler Assessment and Plan Assessment & Plan Shortness of breath and fatigue Cardiac causes excluded. Evaluating pulmonary causes. CHF present without recent exacerbation. Differential includes pulmonary hypertension and diastolic heart failure. - Order  pulmonary function tests and diffusion capacity. - Consider right heart catheterization if tests suggest pulmonary hypertension.  Wheezing Intermittent wheezing noted. Albuterol  ineffective. Trial of daily inhaler therapy considered. - Prescribe Advair HFA inhaler, two puffs BID, rinse mouth post-use. - Evaluate response to inhaler therapy.  Suspected sleep apnea History of snoring and prior negative sleep study. Re-evaluation needed due to potential symptom impact. - Order home sleep study. - Discuss CPAP therapy if diagnosed.  Congestive heart failure with well-managed lower extremity edema CHF with stable edema, no recent exacerbation. Continuing Lasix  therapy. - Continue Lasix  therapy.  Follow up in 3 months  Dorn Chill, MD [facility] Office: [phone]   Current Outpatient Medications:    albuterol  (VENTOLIN  HFA) 108 (90 Base) MCG/ACT inhaler, Inhale 2 puffs into the lungs every 4 (four) hours as needed for wheezing or shortness of breath., Disp: 1 each, Rfl: 0   allopurinol (ZYLOPRIM) 300 MG tablet, Take 300 mg by mouth daily.  , Disp: , Rfl:    aspirin  EC 81 MG tablet, Take 1 tablet (81 mg total) by mouth daily. Swallow whole., Disp: , Rfl:    EPINEPHrine 0.3 mg/0.3 mL IJ SOAJ injection, Inject 0.3 mg into the skin once as needed for allergies., Disp: , Rfl:    fluticasone-salmeterol (ADVAIR HFA) 115-21 MCG/ACT inhaler, Inhale 2 puffs into the lungs 2 (two) times daily., Disp: 1 each, Rfl: 12   Magnesium 400 MG TABS, Take 400 mg by mouth daily., Disp: , Rfl:    Multiple Vitamins-Minerals (CENTRUM WOMEN PO), Take 1 tablet by mouth every morning., Disp: , Rfl:    nitroGLYCERIN  (NITROSTAT ) 0.4 MG SL tablet, Place 1 tablet (0.4 mg total) under the tongue every 5 (five) minutes as needed., Disp: 25 tablet, Rfl: 6   pantoprazole  (PROTONIX ) 40 MG tablet, Take 1 tablet (40 mg total) by mouth 2 (two) times daily., Disp: 60 tablet, Rfl: 0   pravastatin   (PRAVACHOL ) 40 MG tablet, Take 1 tablet (40 mg total) by mouth every evening., Disp: 90 tablet, Rfl: 3   Semaglutide,0.25 or 0.5MG /DOS, (OZEMPIC, 0.25 OR 0.5 MG/DOSE,) 2 MG/1.5ML SOPN, Inject 0.5 mg into the skin once a week., Disp: , Rfl:    spironolactone  (ALDACTONE ) 25 MG tablet, Take 1 tablet (25 mg total) by mouth daily., Disp: 90 tablet, Rfl: 3   benzonatate  (TESSALON ) 100 MG capsule, Take 1 capsule (100 mg total) by mouth 3 (three) times daily as needed for cough. (Patient not taking: Reported on 06/12/2024), Disp: 21 capsule, Rfl: 0   furosemide  (LASIX ) 20 MG tablet, Take 0.5 tablets (10 mg total) by mouth as needed for edema or fluid. (Patient not taking: Reported on 06/12/2024), Disp: 90 tablet, Rfl: 3

## 2024-06-16 LAB — LAB REPORT - SCANNED: EGFR: 38

## 2024-06-18 ENCOUNTER — Other Ambulatory Visit (HOSPITAL_COMMUNITY): Payer: Self-pay

## 2024-06-18 ENCOUNTER — Telehealth: Payer: Self-pay

## 2024-06-18 NOTE — Telephone Encounter (Signed)
*  Pulm  Pharmacy Patient Advocate Encounter   Received notification from CoverMyMeds that prior authorization for Fluticasone-Salmeterol 115-21MCG/ACT aerosol  is required/requested.   Insurance verification completed.   The patient is insured through [employer].   Per test claim:  Advair Diskus/Wixela is preferred by the insurance.  If suggested medication is appropriate, Please send in a new RX and discontinue this one. If not, please advise as to why it's not appropriate so that we may request a Prior Authorization. Please note, some preferred medications may still require a PA.  If the suggested medications have not been trialed and there are no contraindications to their use, the PA will not be submitted, as it will not be approved.   CMM Key: [id number]

## 2024-06-22 MED ORDER — FLUTICASONE-SALMETEROL 100-50 MCG/ACT IN AEPB: 1.0000 | INHALATION_SPRAY | Freq: Two times a day (BID) | RESPIRATORY_TRACT | 5 refills | Status: DC

## 2024-06-22 NOTE — Telephone Encounter (Signed)
 Tammy, Since Dr. Kara is off all week, will you review alternative inhaler and advise?  Thank you.

## 2024-06-22 NOTE — Telephone Encounter (Signed)
 Spoke with patient [facility] Rx has been sent to pharmacy

## 2024-06-22 NOTE — Telephone Encounter (Signed)
 Please let patient know that Advair HFA is not covered.  Change to Advair discus 100 1 puff Twice daily  , rx sent to pharm

## 2024-07-16 ENCOUNTER — Encounter

## 2024-07-29 ENCOUNTER — Ambulatory Visit: Admitting: Podiatry

## 2024-07-29 DIAGNOSIS — L03032 Cellulitis of left toe: Secondary | ICD-10-CM | POA: Diagnosis not present

## 2024-07-29 DIAGNOSIS — L600 Ingrowing nail: Secondary | ICD-10-CM

## 2024-07-29 DIAGNOSIS — Z96659 Presence of unspecified artificial knee joint: Secondary | ICD-10-CM

## 2024-07-29 MED ORDER — DOXYCYCLINE HYCLATE 100 MG PO CAPS: 100.0000 mg | ORAL_CAPSULE | Freq: Two times a day (BID) | ORAL | 0 refills | Status: AC

## 2024-07-29 NOTE — Progress Notes (Unsigned)
 Chief Complaint  Patient presents with   Nail Problem    L great toe nail bilateral side pain, redness, swelling and draining x 4 weeks.  Diabetic A1c 5.7.     HPI: 78 y.o. female presents today with concern of left great toenail being ingrown and infected along the medial border.  She does have a history of multiple joint replacements.  This has her concerned due to the toe infection.  Past Medical History:  Diagnosis Date   Adjustment insomnia    ANKLE PAIN, RIGHT 07/24/2007   Qualifier: Diagnosis of  By: Krystal MD, Reyes LABOR    ASTHMA 11/07/2007   Qualifier: Diagnosis of   By: Latisha CMA, Leigh      Replacing diagnoses that were inactivated after the 11/19/22 regulatory import     Barrett's esophagus 08/2011   CHF (congestive heart failure) (HCC)    CHOLELITHIASIS 04/19/2008   Qualifier: Diagnosis of   By: Krystal MD, Reyes A     Replacing diagnoses that were inactivated after the 11/19/22 regulatory import     Chronic kidney disease, stage 3 unspecified (HCC) 09/22/2018   DEPRESSION 03/21/2007   Qualifier: Diagnosis of  By: Krystal RN, Leeroy     DIABETES MELLITUS, TYPE II 12/02/2007   Qualifier: Diagnosis of  By: Krystal MD, Reyes LABOR    Diaphoresis 08/10/2013   Disturbance of skin sensation 10/10/2007   Qualifier: Diagnosis of  By: Krystal MD, Reyes LABOR    Dyslipidemia 03/21/2007   Qualifier: Diagnosis of   By: Krystal OBIE Leeroy CARMIN 12/10/2008   Qualifier: Diagnosis of  By: Aneita MD NOLIA Gist T    ESOPHAGEAL STRICTURE 12/09/2008   Qualifier: Diagnosis of  By: Earlean CMA (AAMA), Amanda     Fibromyalgia    GASTRITIS 11/25/2007   Qualifier: Diagnosis of   By: Rosabel Penny Balm, Norchel      Replacing diagnoses that were inactivated after the 11/19/22 regulatory import     GASTROESOPHAGEAL REFLUX DISEASE 11/25/2007   Qualifier: Diagnosis of  By: Rosabel Penny Balm, Norchel     GOUT 12/02/2007   Qualifier: Diagnosis of  By: Krystal MD, Reyes LABOR    HEMORRHOIDS 12/09/2008    Qualifier: Diagnosis of   By: Lewellyn CMA (AAMA), Amanda      Replacing diagnoses that were inactivated after the 11/19/22 regulatory import     History of anaphylaxis 09/22/2018   History of colonic polyps 12/09/2008   Qualifier: Diagnosis of   By: Lewellyn CMA (AAMA), Amanda      IMO SNOMED Dx Update Oct 2024     Hyperlipidemia 09/22/2018   HYPERTENSION 03/21/2007   Qualifier: Diagnosis of   By: Krystal, RN, Leeroy      Replacing diagnoses that were inactivated after the 11/19/22 regulatory import     LUNG NODULE 11/10/2007   Qualifier: Diagnosis of  By: Neysa MD, Clinton D    OBESITY NOS 03/21/2007   Qualifier: Diagnosis of   By: Krystal OBIE Leeroy         OBSTRUCTIVE SLEEP APNEA 11/07/2007   Qualifier: Diagnosis of  By: Latisha CMA, Leigh     Ovarian cancer (HCC) 2009   Sleep pattern disturbance 01/11/2020   UNSPECIFIED MYALGIA AND MYOSITIS 11/25/2007   Qualifier: Diagnosis of  By: Krystal MD, Reyes LABOR    Past Surgical History:  Procedure Laterality Date   CESAREAN SECTION     X2   CHOLECYSTECTOMY  LEFT HEART CATH AND CORONARY ANGIOGRAPHY N/A 03/24/2024   Procedure: LEFT HEART CATH AND CORONARY ANGIOGRAPHY;  Surgeon: Elmira Newman PARAS, MD;  Location: [facility];  Service: Cardiovascular;  Laterality: N/A;   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  No open lesions are noted.  There is a patient of the left hallux medial nail border.  There is localized erythema and edema present.  No active drainage is noted but there does appear to be a recently drained purulent material present distally.  Assessment/Plan of Care: 1. Ingrown toenail   2. Status post knee replacement, unspecified laterality   3. Paronychia of great toe, left      Meds ordered this encounter  Medications   doxycycline  (VIBRAMYCIN ) 100 MG capsule     Sig: Take 1 capsule (100 mg total) by mouth 2 (two) times daily for 10 days.    Dispense:  20 capsule    Refill:  0   Discussed findings with the patient today.  Informed the patient that we could go ahead and proceed with an attempted PNA of the medial border of the left great toenail but due to the current infection, she has an increased risk for recurrence of the nail.  Discussed placing the patient on oral antibiotics to address the paronychia and bring her back in 1 week for possible PNA of the medial border of the left hallux.  She was amenable to this.  Doxycycline  was sent to her pharmacy.  This should help protect the joint replacements until we can perform the procedure.  Recommended Epsom salt soaks daily and small amount of antibiotic ointment applied to the nail groove.   Awanda CHARM Imperial, DPM, FACFAS [facility]     [address] [place], [place] [address]                Office [phone]  Fax [phone]    [1]  Allergies Allergen Reactions   Latex Anaphylaxis   Penicillins Anaphylaxis   Aspirin      REACTION: bruising   Codeine    Dapagliflozin    Erythromycin Ethylsuccinate     REACTION: rash

## 2024-07-31 DIAGNOSIS — G4733 Obstructive sleep apnea (adult) (pediatric): Secondary | ICD-10-CM | POA: Diagnosis not present

## 2024-08-05 ENCOUNTER — Ambulatory Visit: Admitting: Podiatry

## 2024-08-05 DIAGNOSIS — L600 Ingrowing nail: Secondary | ICD-10-CM

## 2024-08-05 NOTE — Progress Notes (Unsigned)
 PNA left hallux medial and lateral border.  She has 3 days left of the oral antibiotic.         Subjective:  Patient ID: [name], female    DOB: 12-24-1945,  MRN: [id number]  [name] presents to clinic today for:  Chief Complaint  Patient presents with   Ingrown Toenail    Left hallux, bilateral nail borders.  Not infected A1c was 5.8 in Nov No anti coag.   .  Allergies[1]  Objective:  [name] is a pleasant 78 y.o. female in NAD. AAO x 3.  Vascular Examination: Capillary refill time is 3-5 seconds to toes bilateral. Palpable pedal pulses b/l LE. Digital hair present b/l. No pedal edema b/l. Skin temperature gradient WNL b/l. No varicosities b/l. No cyanosis or clubbing noted b/l.   Dermatological Examination: There is incurvation of the *** nail border.  There is pain on palpation of the affected nail border.  ***  Neurological Examination: Epicritic sensation is intact to the toes.      No data to display           Assessment/Plan: No diagnosis found.  No orders of the defined types were placed in this encounter.   Discussed patient's condition today.  After obtaining patient consent, the *** was anesthetized with a 50:50 mixture of 1% lidocaine  plain and 0.5% bupivacaine plain for a total of 3cc's administered.  Upon confirmation of anesthesia, a freer elevator was utilized to free the *** nail border from the nail bed.  The nail border was then avulsed proximal to the eponychium and removed in toto.  The area was inspected for any remaining spicules.  A chemical matrixectomy was performed with NaOH and neutralized with acetic acid solution.  Antibiotic ointment and a DSD were applied, followed by a Coban dressing.  Patient tolerated the anesthetic and procedure well and will f/u in 2-3 weeks for recheck.  Patient given post-procedure instructions for daily 15-minute Epsom salt soaks, antibiotic ointment and daily use of Bandaids until toe starts to  dry / form eschar.    Return in about 2 weeks (around 08/19/2024) for PNA recheck.   Awanda CHARM Imperial, DPM, FACFAS [facility]     [address] [place], [place] [address]                Office [phone]  Fax [phone]    [1]  Allergies Allergen Reactions   Latex Anaphylaxis   Penicillins Anaphylaxis   Aspirin      REACTION: bruising   Codeine    Dapagliflozin    Erythromycin Ethylsuccinate     REACTION: rash

## 2024-08-05 NOTE — Patient Instructions (Addendum)

## 2024-08-18 ENCOUNTER — Other Ambulatory Visit: Payer: Self-pay

## 2024-08-18 MED ORDER — FLUTICASONE-SALMETEROL 100-50 MCG/ACT IN AEPB: 1.0000 | INHALATION_SPRAY | Freq: Two times a day (BID) | RESPIRATORY_TRACT | 5 refills | Status: AC

## 2024-08-19 ENCOUNTER — Ambulatory Visit (INDEPENDENT_AMBULATORY_CARE_PROVIDER_SITE_OTHER): Admitting: Podiatry

## 2024-08-19 DIAGNOSIS — L600 Ingrowing nail: Secondary | ICD-10-CM | POA: Diagnosis not present

## 2024-08-19 NOTE — Progress Notes (Unsigned)
" °   °  °  Subjective:  Patient ID: [name], female    DOB: Feb 08, 1946,  MRN: [id number]  Chief Complaint  Patient presents with   PNA check    Left hallux, PNA, bilat borders. Looks pretty good, does have some small drainage, but the scab is built up. Toe is a little red.     [name] presents to clinic today for f/u of PNA to the left hallux nail borders.  Notes minimal to no pain.  She is wondering if she needs to have anything done to the right great toe.  PCP is Clemmie Nest, MD.  Allergies[1]  Objective:  Vascular Examination: Capillary refill time is 3-5 seconds to toes bilateral. Palpable pedal pulses b/l LE. Digital hair present b/l. No pedal edema b/l. Skin temperature gradient WNL b/l. No varicosities b/l. No cyanosis or clubbing noted b/l.   Dermatological Examination: Upon inspection of the PNA site, there are no clinical signs of infection.  No purulence, no necrosis, no malodor present.  Minimal to no erythema present.  Eschar formed along nail margin.  Minimal to no pain on palpation of area.  Right great toenail does have some incurvated borders but no paronychia or true onychocryptosis is appreciated today.  Assessment/Plan: 1. Ingrown toenail     Patient can discontinue all postprocedure instructions for the left great toenail.  Informed her that we could potentially proceed with the procedure on the right great toe in the future if she starts to have any issues but is not necessary at this time.  Follow-up as needed  Awanda CHARM Imperial, DPM, FACFAS [facility]     [address] [place], [place] [address]                Office [phone]  Fax [phone]     [1]  Allergies Allergen Reactions   Latex Anaphylaxis   Penicillins Anaphylaxis   Aspirin      REACTION: bruising   Codeine    Dapagliflozin    Erythromycin Ethylsuccinate     REACTION: rash   "

## 2024-08-20 ENCOUNTER — Ambulatory Visit: Payer: Self-pay | Admitting: Pulmonary Disease

## 2024-08-21 NOTE — Telephone Encounter (Signed)
 Spoke with patient VBU, will speak with with about it at next OV appt

## 2024-08-22 ENCOUNTER — Encounter (HOSPITAL_BASED_OUTPATIENT_CLINIC_OR_DEPARTMENT_OTHER): Payer: Self-pay

## 2024-08-22 ENCOUNTER — Ambulatory Visit (INDEPENDENT_AMBULATORY_CARE_PROVIDER_SITE_OTHER): Admit: 2024-08-22 | Discharge: 2024-08-22 | Disposition: A | Discharge status: Home / Self Care | Admitting: Radiology

## 2024-08-22 ENCOUNTER — Ambulatory Visit (HOSPITAL_BASED_OUTPATIENT_CLINIC_OR_DEPARTMENT_OTHER)
Admission: EM | Admit: 2024-08-22 | Discharge: 2024-08-22 | Disposition: A | Discharge status: Home / Self Care | Attending: Family Medicine | Admitting: Family Medicine

## 2024-08-22 DIAGNOSIS — R0602 Shortness of breath: Secondary | ICD-10-CM | POA: Diagnosis not present

## 2024-08-22 DIAGNOSIS — R058 Other specified cough: Secondary | ICD-10-CM

## 2024-08-22 LAB — POCT INFLUENZA A/B
Influenza A, POC: NEGATIVE
Influenza B, POC: NEGATIVE

## 2024-08-22 MED ORDER — DOXYCYCLINE HYCLATE 100 MG PO CAPS: 100.0000 mg | ORAL_CAPSULE | Freq: Two times a day (BID) | ORAL | 0 refills | Status: AC

## 2024-08-22 MED ORDER — PREDNISONE 20 MG PO TABS: 40.0000 mg | ORAL_TABLET | Freq: Every day | ORAL | 0 refills | Status: AC

## 2024-08-22 NOTE — ED Provider Notes (Signed)
 " [facility]    CSN: [id number] Arrival date & time: 08/22/24  1207      History   Chief Complaint Chief Complaint  Patient presents with   Cough   chest congestion    HPI [name] is a 79 y.o. female.   Patient is a 78 year old female who presents today with onset of cough, nasal congestion, and chest congestion since Thursday. Patient reports her family members have been sick as well and they were treated with prednisone  and levaquin . Patient states low grade fever as well.  Taking over-the-counter Mucinex without any relief.  Symptoms have worsened.  History of asthma.    Cough   Past Medical History:  Diagnosis Date   Adjustment insomnia    ANKLE PAIN, RIGHT 07/24/2007   Qualifier: Diagnosis of  By: Krystal MD, Reyes LABOR    ASTHMA 11/07/2007   Qualifier: Diagnosis of   By: Latisha CMA, Leigh      Replacing diagnoses that were inactivated after the 11/19/22 regulatory import     Barrett's esophagus 08/2011   CHF (congestive heart failure) (HCC)    CHOLELITHIASIS 04/19/2008   Qualifier: Diagnosis of   By: Krystal MD, Reyes A     Replacing diagnoses that were inactivated after the 11/19/22 regulatory import     Chronic kidney disease, stage 3 unspecified (HCC) 09/22/2018   DEPRESSION 03/21/2007   Qualifier: Diagnosis of  By: Krystal RN, Leeroy     DIABETES MELLITUS, TYPE II 12/02/2007   Qualifier: Diagnosis of  By: Krystal MD, Reyes LABOR    Diaphoresis 08/10/2013   Disturbance of skin sensation 10/10/2007   Qualifier: Diagnosis of  By: Krystal MD, Reyes LABOR    Dyslipidemia 03/21/2007   Qualifier: Diagnosis of   By: Krystal OBIE Leeroy CARMIN 12/10/2008   Qualifier: Diagnosis of  By: Aneita MD NOLIA Gist T    ESOPHAGEAL STRICTURE 12/09/2008   Qualifier: Diagnosis of  By: Earlean CMA (AAMA), Amanda     Fibromyalgia    GASTRITIS 11/25/2007   Qualifier: Diagnosis of   By: Rosabel Penny Balm, Norchel      Replacing diagnoses that were inactivated after the  11/19/22 regulatory import     GASTROESOPHAGEAL REFLUX DISEASE 11/25/2007   Qualifier: Diagnosis of  By: Rosabel Penny Balm, Norchel     GOUT 12/02/2007   Qualifier: Diagnosis of  By: Krystal MD, Reyes LABOR    HEMORRHOIDS 12/09/2008   Qualifier: Diagnosis of   By: Lewellyn CMA (AAMA), Amanda      Replacing diagnoses that were inactivated after the 11/19/22 regulatory import     History of anaphylaxis 09/22/2018   History of colonic polyps 12/09/2008   Qualifier: Diagnosis of   By: Lewellyn CMA (AAMA), Amanda      IMO SNOMED Dx Update Oct 2024     Hyperlipidemia 09/22/2018   HYPERTENSION 03/21/2007   Qualifier: Diagnosis of   By: Krystal, RN, Leeroy      Replacing diagnoses that were inactivated after the 11/19/22 regulatory import     LUNG NODULE 11/10/2007   Qualifier: Diagnosis of  By: Neysa MD, Clinton D    OBESITY NOS 03/21/2007   Qualifier: Diagnosis of   By: Krystal OBIE Leeroy         OBSTRUCTIVE SLEEP APNEA 11/07/2007   Qualifier: Diagnosis of  By: Latisha CMA, Leigh     Ovarian cancer (HCC) 2009   Sleep pattern disturbance 01/11/2020   UNSPECIFIED MYALGIA  AND MYOSITIS 11/25/2007   Qualifier: Diagnosis of  By: Krystal MD, Reyes LABOR     Patient Active Problem List   Diagnosis Date Noted   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/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   Abnormal nuclear cardiac imaging test 03/19/2024   Angina pectoris 03/19/2024   Dyspnea on exertion 03/19/2024   Obesity (BMI 35.0-39.9 without comorbidity) 03/19/2024   Low back pain 11/18/2023   CHF (congestive heart failure) (HCC) 12/11/2021   Sleep pattern disturbance 01/11/2020   Hyperlipidemia 09/22/2018   History of anaphylaxis 09/22/2018   Chronic kidney disease, stage 3 unspecified (HCC) 09/22/2018   Diaphoresis 08/10/2013   Adjustment insomnia 08/2011   Barrett's esophagus 08/2011   DYSPHAGIA 12/10/2008   HEMORRHOIDS 12/09/2008   ESOPHAGEAL STRICTURE 12/09/2008   History of colonic polyps 12/09/2008   CHOLELITHIASIS 04/19/2008    DIABETES MELLITUS, TYPE II 12/02/2007   GOUT 12/02/2007   GASTROESOPHAGEAL REFLUX DISEASE 11/25/2007   GASTRITIS 11/25/2007   UNSPECIFIED MYALGIA AND MYOSITIS 11/25/2007   LUNG NODULE 11/10/2007   OBSTRUCTIVE SLEEP APNEA 11/07/2007   ASTHMA 11/07/2007   Asthma 11/07/2007   DISTURBANCE OF SKIN SENSATION 10/10/2007   Ovarian cancer (HCC) 2009   ANKLE PAIN, RIGHT 07/24/2007   Dyslipidemia 03/21/2007   OBESITY NOS 03/21/2007   DEPRESSION 03/21/2007   HYPERTENSION 03/21/2007   HYPERLIPIDEMIA 03/21/2007    Past Surgical History:  Procedure Laterality Date   CESAREAN SECTION     X2   CHOLECYSTECTOMY     LEFT HEART CATH AND CORONARY ANGIOGRAPHY N/A 03/24/2024   Procedure: LEFT HEART CATH AND CORONARY ANGIOGRAPHY;  Surgeon: Elmira Newman PARAS, MD;  Location: [facility];  Service: Cardiovascular;  Laterality: N/A;   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      Home Medications    Prior to Admission medications  Medication Sig Start Date End Date Taking? Authorizing Provider  doxycycline  (VIBRAMYCIN ) 100 MG capsule Take 1 capsule (100 mg total) by mouth 2 (two) times daily for 7 days. 08/22/24 08/29/24 Yes Lauralei Clouse A, FNP  predniSONE  (DELTASONE ) 20 MG tablet Take 2 tablets (40 mg total) by mouth daily with breakfast for 5 days. 08/22/24 08/27/24 Yes Ourania Hamler A, FNP  albuterol  (VENTOLIN  HFA) 108 (90 Base) MCG/ACT inhaler Inhale 2 puffs into the lungs every 4 (four) hours as needed for wheezing or shortness of breath. 08/03/23   Banister, Pamela K, MD  allopurinol (ZYLOPRIM) 300 MG tablet Take 300 mg by mouth daily.      [provider]  EPINEPHrine 0.3 mg/0.3 mL IJ SOAJ injection Inject 0.3 mg into the skin once as needed for allergies. 07/31/15   [provider]  fluticasone -salmeterol (ADVAIR DISKUS) 100-50  MCG/ACT AEPB Inhale 1 puff into the lungs 2 (two) times daily. 08/18/24   Parrett, Madelin RAMAN, NP  Magnesium 400 MG TABS Take 400 mg by mouth daily.    [provider]  Multiple Vitamins-Minerals (CENTRUM WOMEN PO) Take 1 tablet by mouth every morning.    [provider]  nitroGLYCERIN  (NITROSTAT ) 0.4 MG SL tablet Place 1 tablet (0.4 mg total) under the tongue every 5 (five) minutes as needed. 03/19/24 08/19/24  Revankar, Jennifer SAUNDERS, MD  pantoprazole  (PROTONIX ) 40 MG tablet Take 1 tablet (40 mg total) by mouth 2 (two) times daily. 06/12/16  Esterwood, Amy S, PA-C  pravastatin  (PRAVACHOL ) 40 MG tablet Take 1 tablet (40 mg total) by mouth every evening. 04/24/24   Carlin Delon BROCKS, NP  spironolactone  (ALDACTONE ) 25 MG tablet Take 1 tablet (25 mg total) by mouth daily. 04/24/24   Carlin Delon BROCKS, NP    Family History Family History  Problem Relation Age of Onset   Breast cancer Mother    Ovarian cancer Mother    Coronary artery disease Father    Diabetes Father    Heart attack Father    Multiple myeloma Sister    Heart attack Brother     Social History Social History[1]   Allergies   Latex, Penicillins, Aspirin , Codeine, Dapagliflozin, and Erythromycin ethylsuccinate   Review of Systems Review of Systems  Respiratory:  Positive for cough.      Physical Exam Triage Vital Signs ED Triage Vitals  Encounter Vitals Group     BP 08/22/24 1238 (!) 147/85     Girls Systolic BP Percentile --      Girls Diastolic BP Percentile --      Boys Systolic BP Percentile --      Boys Diastolic BP Percentile --      Pulse Rate 08/22/24 1238 65     Resp 08/22/24 1238 20     Temp 08/22/24 1238 98.1 F (36.7 C)     Temp Source 08/22/24 1238 Oral     SpO2 08/22/24 1238 96 %     Weight --      Height --      Head Circumference --      Peak Flow --      Pain Score 08/22/24 1240 3     Pain Loc --      Pain Education --      Exclude from Growth Chart --    No data  found.  Updated Vital Signs BP (!) 147/85 (BP Location: Right Arm)   Pulse 65   Temp 98.1 F (36.7 C) (Oral)   Resp 20   SpO2 96%   Visual Acuity Right Eye Distance:   Left Eye Distance:   Bilateral Distance:    Right Eye Near:   Left Eye Near:    Bilateral Near:     Physical Exam Constitutional:      General: She is not in acute distress.    Appearance: Normal appearance. She is not ill-appearing, toxic-appearing or diaphoretic.  HENT:     Head: Normocephalic and atraumatic.     Right Ear: Tympanic membrane and ear canal normal.     Left Ear: Tympanic membrane and ear canal normal.     Nose: Congestion present.     Mouth/Throat:     Pharynx: Oropharynx is clear.  Eyes:     Conjunctiva/sclera: Conjunctivae normal.  Cardiovascular:     Rate and Rhythm: Normal rate and regular rhythm.     Pulses: Normal pulses.     Heart sounds: Normal heart sounds.  Pulmonary:     Effort: Pulmonary effort is normal.     Breath sounds: Wheezing present.  Skin:    General: Skin is warm and dry.  Neurological:     Mental Status: She is alert.  Psychiatric:        Mood and Affect: Mood normal.      UC Treatments / Results  Labs (all labs ordered are listed, but only abnormal results are displayed) Labs Reviewed  POCT INFLUENZA A/B - Normal    EKG  Radiology DG Chest 2 View Result Date: 08/22/2024 EXAM: 2 VIEW(S) XRAY OF THE CHEST 08/22/2024 01:04:00 PM COMPARISON: 10/17/2023 CLINICAL HISTORY: Shortness of breath. Post cholecystectomy. FINDINGS: LUNGS AND PLEURA: Linear opacity in lingula, likely scarring or atelectasis. No pleural effusion. No pneumothorax. HEART AND MEDIASTINUM: No acute abnormality of the cardiac and mediastinal silhouettes. BONES AND SOFT TISSUES: Multilevel degenerative changes of thoracic spine. IMPRESSION: 1. Linear lingular atelectasis. Electronically signed by: Morgane Naveau MD 08/22/2024 01:11 PM EST RP Workstation: [id number]     Procedures Procedures (including critical care time)  Medications Ordered in UC Medications - No data to display  Initial Impression / Assessment and Plan / UC Course  I have reviewed the triage vital signs and the nursing notes.  Pertinent labs & imaging results that were available during my care of the patient were reviewed by me and considered in my medical decision making (see chart for details).     Cough-no specific concerns on exam today.  X-ray done and revealedLinear lingular atelectasis.  Flu test negative.  Treating for an upper respiratory infection at this time.  Medications as prescribed.  Over-the-counter medications as needed.  Follow-up as needed Final Clinical Impressions(s) / UC Diagnoses   Final diagnoses:  Other cough     Discharge Instructions      Treating you for an upper respiratory infection.  Take the antibiotics and prednisone  as prescribed.  You can continue over-the-counter medicines for symptoms as needed.  Follow-up as needed   ED Prescriptions     Medication Sig Dispense Auth. Provider   doxycycline  (VIBRAMYCIN ) 100 MG capsule Take 1 capsule (100 mg total) by mouth 2 (two) times daily for 7 days. 14 capsule Mersadie Kavanaugh A, FNP   predniSONE  (DELTASONE ) 20 MG tablet Take 2 tablets (40 mg total) by mouth daily with breakfast for 5 days. 10 tablet Adah Wilbert LABOR, FNP      PDMP not reviewed this encounter.    [1]  Social History Tobacco Use   Smoking status: Never   Smokeless tobacco: Never  Substance Use Topics   Alcohol use: No   Drug use: No     Adah Wilbert LABOR, FNP 08/22/24 1417  "

## 2024-08-22 NOTE — Discharge Instructions (Addendum)
 Treating you for an upper respiratory infection.  Take the antibiotics and prednisone  as prescribed.  You can continue over-the-counter medicines for symptoms as needed.  Follow-up as needed

## 2024-08-22 NOTE — ED Triage Notes (Signed)
 Onset of cough, nasal congestion, and chest congestion since Thursday. Patient reports her family members have been sick as well and they were treated with prednisone  and levaquin . Patient states low grade fever as well.

## 2024-08-24 ENCOUNTER — Ambulatory Visit (HOSPITAL_COMMUNITY): Payer: Self-pay

## 2024-09-14 ENCOUNTER — Ambulatory Visit: Admitting: Pulmonary Disease

## 2024-09-14 ENCOUNTER — Encounter

## 2024-09-16 ENCOUNTER — Ambulatory Visit

## 2024-09-16 ENCOUNTER — Other Ambulatory Visit: Payer: Self-pay

## 2024-09-16 ENCOUNTER — Ambulatory Visit (INDEPENDENT_AMBULATORY_CARE_PROVIDER_SITE_OTHER): Admitting: Pulmonary Disease

## 2024-09-16 ENCOUNTER — Other Ambulatory Visit: Payer: Self-pay | Admitting: *Deleted

## 2024-09-16 ENCOUNTER — Encounter: Payer: Self-pay | Admitting: Pulmonary Disease

## 2024-09-16 VITALS — BP 156/94 | HR 91 | Ht 62.0 in | Wt 212.0 lb

## 2024-09-16 DIAGNOSIS — G4733 Obstructive sleep apnea (adult) (pediatric): Secondary | ICD-10-CM

## 2024-09-16 DIAGNOSIS — J4520 Mild intermittent asthma, uncomplicated: Secondary | ICD-10-CM

## 2024-09-16 DIAGNOSIS — J449 Chronic obstructive pulmonary disease, unspecified: Secondary | ICD-10-CM

## 2024-09-16 LAB — PULMONARY FUNCTION TEST
DL/VA % pred: 107 %
DL/VA: 4.47 ml/min/mmHg/L
DLCO cor % pred: 95 %
DLCO cor: 16.96 ml/min/mmHg
DLCO unc % pred: 95 %
DLCO unc: 16.96 ml/min/mmHg
FEF 25-75 Post: 2.38 L/s
FEF 25-75 Pre: 1.39 L/s
FEF2575-%Change-Post: 71 %
FEF2575-%Pred-Post: 158 %
FEF2575-%Pred-Pre: 92 %
FEV1-%Change-Post: 19 %
FEV1-%Pred-Post: 87 %
FEV1-%Pred-Pre: 73 %
FEV1-Post: 1.65 L
FEV1-Pre: 1.39 L
FEV1FVC-%Change-Post: -4 %
FEV1FVC-%Pred-Pre: 110 %
FEV6-%Change-Post: 27 %
FEV6-%Pred-Post: 87 %
FEV6-%Pred-Pre: 68 %
FEV6-Post: 2.1 L
FEV6-Pre: 1.65 L
FEV6FVC-%Pred-Post: 105 %
FEV6FVC-%Pred-Pre: 105 %
FVC-%Change-Post: 25 %
FVC-%Pred-Post: 82 %
FVC-%Pred-Pre: 66 %
FVC-Post: 2.1 L
FVC-Pre: 1.68 L
Post FEV1/FVC ratio: 79 %
Post FEV6/FVC ratio: 100 %
Pre FEV1/FVC ratio: 82 %
Pre FEV6/FVC Ratio: 100 %
RV % pred: 215 %
RV: 4.76 L
TLC % pred: 150 %
TLC: 7.13 L

## 2024-09-16 MED ORDER — BREZTRI AEROSPHERE 160-9-4.8 MCG/ACT IN AERO: INHALATION_SPRAY | RESPIRATORY_TRACT | Status: DC

## 2024-09-16 MED ORDER — BREZTRI AEROSPHERE 160-9-4.8 MCG/ACT IN AERO: INHALATION_SPRAY | RESPIRATORY_TRACT | Status: AC

## 2024-09-16 NOTE — Progress Notes (Signed)
 Full PFT performed today.

## 2024-09-16 NOTE — Patient Instructions (Addendum)
 Your home sleep study shows mild obstructive sleep apnea, we will order you a CPAP machine and mask fitting session  Your breathing tests show mild obstructive lung disease  Try breztri  inhaler 2 puffs twice daily - rinse mouth out after each use  Stop using the wixella while using breztri   Use albuterol  inhaler 1-2 puffs every 4-6 hours as needed  Continue to work on weight loss, congrats on your hardwork so far.  Follow up in 6 weeks to review the CPAP compliance report

## 2024-09-16 NOTE — Patient Instructions (Signed)
 Full PFT performed today.

## 2024-09-16 NOTE — Progress Notes (Signed)
 "  Established Patient Pulmonology Office Visit   Subjective:  Patient ID: [name], female    DOB: 1946/02/16  MRN: [id number]  CC:  Chief Complaint  Patient presents with   Medical Management of Chronic Issues    Pt states post PFT     Discussed the use of AI scribe software for clinical note transcription with the patient, who gave verbal consent to proceed.  History of Present Illness [name] is a 79 year old female with a history of tracheostomy, CHF, DMII, obesity and hypertension who returns to pulmonary clinic for follow up of dyspnea. She is accompanied by her husband, [name].  She has had persistent shortness of breath since October, only partially responsive to her wixella 100-17mcg inhaler 1 puff twice daily. She was treated at urgent care with prednisone  and an antibiotic at the beginning of the year, but symptoms lasted about three weeks and she still has a cough with clear mucus. She sometimes wakes at night with shortness of breath, and her husband notes episodes of apnea during sleep.  She has a tracheostomy and previously needed dilations every three months, now every six to eight months, with the last dilation ten months ago.  A recent sleep study showed an apnea-hypopnea index of 6.7 events per hour, snoring, and oxygen saturation dropping to 83%. She feels exhausted over the past six months. She lost weight from 260 to 211 pounds.  She takes Lasix  20 mg every morning for leg swelling.        ROS   Current Medications[1]      Objective:  BP (!) 156/94   Pulse 91   Ht 5' 2 (1.575 m)   Wt 212 lb (96.2 kg)   SpO2 97%   BMI 38.78 kg/m     Physical Exam Constitutional:      General: She is not in acute distress.    Appearance: Normal appearance. She is obese.  Eyes:     General: No scleral icterus.    Conjunctiva/sclera: Conjunctivae normal.  Cardiovascular:     Rate and Rhythm: Normal rate and regular rhythm.  Pulmonary:     Breath  sounds: No wheezing, rhonchi or rales.  Musculoskeletal:     Right lower leg: No edema.     Left lower leg: No edema.  Skin:    General: Skin is warm and dry.  Neurological:     General: No focal deficit present.      Diagnostic Review:  Last CBC Lab Results  Component Value Date   WBC 6.5 03/19/2024   HGB 13.4 03/19/2024   HCT 41.1 03/19/2024   MCV 89 03/19/2024   MCH 29.1 03/19/2024   RDW 13.5 03/19/2024   PLT 233 03/19/2024   Last metabolic panel Lab Results  Component Value Date   GLUCOSE 127 (H) 04/29/2024   NA 144 04/29/2024   K 4.6 04/29/2024   CL 105 04/29/2024   CO2 23 04/29/2024   BUN 14 04/29/2024   CREATININE 1.08 (H) 04/29/2024   EGFR 38.0 06/16/2024   CALCIUM  9.5 04/29/2024   PROT 6.1 04/24/2024   ALBUMIN 4.2 04/24/2024   LABGLOB 1.9 04/24/2024   AGRATIO 2.2 09/17/2022   BILITOT 0.7 04/24/2024   ALKPHOS 98 04/24/2024   AST 15 04/24/2024   ALT 14 04/24/2024   ANIONGAP 13 10/17/2023       Assessment & Plan:   Assessment & Plan Chronic obstructive pulmonary disease, unspecified COPD type (HCC)  Orders:   AMB  referral to pulmonary rehabilitation   Assessment and Plan Assessment & Plan Chronic obstructive pulmonary disease Mild obstructive pattern with significant bronchodilator response and air trapping. Normal diffusion capacity. - Provided Breztri  samples, two puffs morning and evening, rinse mouth after use. - Discontinue Wixela while using Breztri . - If Breztri  not covered, consider returning to [facility] and adding second inhaler. - Referred to pulmonary rehab at [facility] for exercise and breathing techniques.  Obstructive sleep apnea Mild obstructive sleep apnea with AHI of 6.7. Oxygen saturation dropped to 83% during sleep study. CPAP therapy recommended due to daytime fatigue and sleep disturbances. Addressed concerns about CPAP mask with Dreamwear option. - Ordered CPAP machine with Dreamwear mask and supplies. - Scheduled  compliance check one month after starting CPAP for usage of four hours or more per night. - Encouraged continued weight loss efforts.      Return in about 6 weeks (around 10/28/2024) for f/u visit Dr. Kara.   Dorn KATHEE Kara, MD     [1]  Current Outpatient Medications:    albuterol  (VENTOLIN  HFA) 108 (90 Base) MCG/ACT inhaler, Inhale 2 puffs into the lungs every 4 (four) hours as needed for wheezing or shortness of breath., Disp: 1 each, Rfl: 0   allopurinol (ZYLOPRIM) 300 MG tablet, Take 300 mg by mouth daily.  , Disp: , Rfl:    EPINEPHrine 0.3 mg/0.3 mL IJ SOAJ injection, Inject 0.3 mg into the skin once as needed for allergies., Disp: , Rfl:    fluticasone -salmeterol (ADVAIR DISKUS) 100-50 MCG/ACT AEPB, Inhale 1 puff into the lungs 2 (two) times daily., Disp: 1 each, Rfl: 5   Magnesium 400 MG TABS, Take 400 mg by mouth daily., Disp: , Rfl:    Multiple Vitamins-Minerals (CENTRUM WOMEN PO), Take 1 tablet by mouth every morning., Disp: , Rfl:    nitroGLYCERIN  (NITROSTAT ) 0.4 MG SL tablet, Place 1 tablet (0.4 mg total) under the tongue every 5 (five) minutes as needed., Disp: 25 tablet, Rfl: 6   pantoprazole  (PROTONIX ) 40 MG tablet, Take 1 tablet (40 mg total) by mouth 2 (two) times daily., Disp: 60 tablet, Rfl: 0   pravastatin  (PRAVACHOL ) 40 MG tablet, Take 1 tablet (40 mg total) by mouth every evening., Disp: 90 tablet, Rfl: 3   spironolactone  (ALDACTONE ) 25 MG tablet, Take 1 tablet (25 mg total) by mouth daily., Disp: 90 tablet, Rfl: 3   budesonide-glycopyrrolate-formoterol (BREZTRI  AEROSPHERE) 160-9-4.8 MCG/ACT AERO inhaler, 4 Samples, Disp: , Rfl:   "

## 2024-10-21 ENCOUNTER — Ambulatory Visit: Admitting: Cardiology

## 2024-11-04 ENCOUNTER — Ambulatory Visit: Admitting: Pulmonary Disease
